# Patient Record
Sex: Male | Born: 1945
Health system: Southern US, Community
[De-identification: ages and names within clinical notes are randomized; demographics above are authoritative.]

## PROBLEM LIST (undated history)

## (undated) DIAGNOSIS — N200 Calculus of kidney: Secondary | ICD-10-CM

## (undated) DIAGNOSIS — R519 Headache, unspecified: Secondary | ICD-10-CM

## (undated) DIAGNOSIS — D649 Anemia, unspecified: Secondary | ICD-10-CM

## (undated) DIAGNOSIS — I639 Cerebral infarction, unspecified: Secondary | ICD-10-CM

## (undated) DIAGNOSIS — C181 Malignant neoplasm of appendix: Secondary | ICD-10-CM

## (undated) DIAGNOSIS — Z87442 Personal history of urinary calculi: Secondary | ICD-10-CM

## (undated) DIAGNOSIS — I10 Essential (primary) hypertension: Secondary | ICD-10-CM

## (undated) DIAGNOSIS — J189 Pneumonia, unspecified organism: Secondary | ICD-10-CM

## (undated) DIAGNOSIS — M1990 Unspecified osteoarthritis, unspecified site: Secondary | ICD-10-CM

## (undated) DIAGNOSIS — H409 Unspecified glaucoma: Secondary | ICD-10-CM

## (undated) DIAGNOSIS — K219 Gastro-esophageal reflux disease without esophagitis: Secondary | ICD-10-CM

## (undated) DIAGNOSIS — F32A Depression, unspecified: Secondary | ICD-10-CM

## (undated) DIAGNOSIS — G4733 Obstructive sleep apnea (adult) (pediatric): Secondary | ICD-10-CM

## (undated) DIAGNOSIS — F411 Generalized anxiety disorder: Secondary | ICD-10-CM

## (undated) DIAGNOSIS — R51 Headache: Secondary | ICD-10-CM

## (undated) DIAGNOSIS — Z77098 Contact with and (suspected) exposure to other hazardous, chiefly nonmedicinal, chemicals: Secondary | ICD-10-CM

## (undated) DIAGNOSIS — Z973 Presence of spectacles and contact lenses: Secondary | ICD-10-CM

## (undated) DIAGNOSIS — R399 Unspecified symptoms and signs involving the genitourinary system: Secondary | ICD-10-CM

## (undated) DIAGNOSIS — Z8619 Personal history of other infectious and parasitic diseases: Secondary | ICD-10-CM

## (undated) DIAGNOSIS — F40240 Claustrophobia: Secondary | ICD-10-CM

## (undated) DIAGNOSIS — F4310 Post-traumatic stress disorder, unspecified: Secondary | ICD-10-CM

## (undated) DIAGNOSIS — F419 Anxiety disorder, unspecified: Secondary | ICD-10-CM

## (undated) HISTORY — PX: NO PAST SURGERIES: SHX2092

## (undated) HISTORY — PX: COLONOSCOPY: SHX174

## (undated) HISTORY — PX: WISDOM TOOTH EXTRACTION: SHX21

---

## 2000-06-28 DIAGNOSIS — Z8673 Personal history of transient ischemic attack (TIA), and cerebral infarction without residual deficits: Secondary | ICD-10-CM

## 2000-06-28 HISTORY — DX: Personal history of transient ischemic attack (TIA), and cerebral infarction without residual deficits: Z86.73

## 2006-03-13 ENCOUNTER — Emergency Department (HOSPITAL_COMMUNITY): Admission: EM | Admit: 2006-03-13 | Discharge: 2006-03-13 | Payer: Self-pay | Admitting: Emergency Medicine

## 2008-06-25 ENCOUNTER — Ambulatory Visit (HOSPITAL_COMMUNITY): Admission: RE | Admit: 2008-06-25 | Discharge: 2008-06-25 | Payer: Self-pay | Admitting: Pulmonary Disease

## 2008-08-06 ENCOUNTER — Ambulatory Visit (HOSPITAL_COMMUNITY): Admission: RE | Admit: 2008-08-06 | Discharge: 2008-08-06 | Payer: Self-pay | Admitting: Pulmonary Disease

## 2008-08-09 ENCOUNTER — Ambulatory Visit (HOSPITAL_COMMUNITY): Admission: RE | Admit: 2008-08-09 | Discharge: 2008-08-09 | Payer: Self-pay | Admitting: Pulmonary Disease

## 2008-10-09 ENCOUNTER — Ambulatory Visit (HOSPITAL_COMMUNITY): Admission: RE | Admit: 2008-10-09 | Discharge: 2008-10-09 | Payer: Self-pay | Admitting: Pulmonary Disease

## 2009-02-07 ENCOUNTER — Ambulatory Visit (HOSPITAL_COMMUNITY): Admission: RE | Admit: 2009-02-07 | Discharge: 2009-02-07 | Payer: Self-pay | Admitting: Pulmonary Disease

## 2010-10-13 LAB — CREATININE, SERUM
Creatinine, Ser: 1.31 mg/dL (ref 0.4–1.5)
GFR calc non Af Amer: 55 mL/min — ABNORMAL LOW (ref >60)

## 2012-05-30 ENCOUNTER — Telehealth: Payer: Self-pay | Admitting: *Deleted

## 2012-05-30 NOTE — Telephone Encounter (Signed)
Dr Patsey Berthold has referred Mr Hirschman for a screening colonoscopy. His wife called for him to be triaged for a colonoscopy in 2014.Please call her back. Thanks.

## 2012-05-31 NOTE — Telephone Encounter (Signed)
Pt said he is not having any problems. He prefers to wait and schedule sometime toward the end of Jan or first of Feb. He is on my recall for then.

## 2012-07-04 ENCOUNTER — Telehealth: Payer: Self-pay

## 2012-07-04 NOTE — Telephone Encounter (Signed)
Pt had said he would like to schedule the end of Jan 2014 for his colonoscopy. I called and he still wants to check out his insurance first and said he will call me back.

## 2012-07-17 NOTE — Telephone Encounter (Signed)
LMOM to call.

## 2012-08-07 NOTE — Telephone Encounter (Signed)
Called.Phone number has been disconnected. Mailing a letter to call.

## 2013-11-07 ENCOUNTER — Other Ambulatory Visit (HOSPITAL_COMMUNITY): Payer: Self-pay | Admitting: Ophthalmology

## 2013-11-07 DIAGNOSIS — H34829 Venous engorgement, unspecified eye: Secondary | ICD-10-CM

## 2013-11-08 ENCOUNTER — Ambulatory Visit (HOSPITAL_COMMUNITY): Payer: Medicare Other

## 2013-11-08 ENCOUNTER — Ambulatory Visit (HOSPITAL_COMMUNITY)
Admission: RE | Admit: 2013-11-08 | Discharge: 2013-11-08 | Disposition: A | Discharge status: Home / Self Care | Payer: Medicare Other | Source: Ambulatory Visit | Attending: Ophthalmology | Admitting: Ophthalmology

## 2013-11-08 DIAGNOSIS — H34829 Venous engorgement, unspecified eye: Secondary | ICD-10-CM

## 2013-12-11 ENCOUNTER — Other Ambulatory Visit (HOSPITAL_COMMUNITY): Payer: Self-pay

## 2013-12-11 DIAGNOSIS — G4730 Sleep apnea, unspecified: Secondary | ICD-10-CM

## 2013-12-21 ENCOUNTER — Ambulatory Visit: Payer: Medicare Other | Attending: Ophthalmology | Admitting: Sleep Medicine

## 2013-12-21 DIAGNOSIS — G4733 Obstructive sleep apnea (adult) (pediatric): Secondary | ICD-10-CM | POA: Insufficient documentation

## 2013-12-21 DIAGNOSIS — G4730 Sleep apnea, unspecified: Secondary | ICD-10-CM

## 2013-12-26 NOTE — Progress Notes (Signed)
  [place] A. Merlene Laughter, MD     [email or web address]        NOCTURNAL POLYSOMNOGRAM    LOCATION: SLEEP LAB FACILITY: [place]   PHYSICIAN: Arshiya Jakes A. Merlene Laughter, M.D.   DATE OF STUDY: 12/21/2013.   REFERRING PHYSICIAN: Vernie Shanks.  INDICATIONS: This is a 68 year old who presents with fatigue, difficulty sleeping and loud snoring.  MEDICATIONS:  Prior to Admission medications   Not on File      EPWORTH SLEEPINESS SCALE: 4.   BMI: 33.   ARCHITECTURAL SUMMARY: Total recording time was 411 minutes. Sleep efficiency 77 %. Sleep latency 4 minutes. REM latency 77 minutes. Stage NI 7 %, N2 64 % and N3 11 % and REM sleep 18 %.    RESPIRATORY DATA:  Baseline oxygen saturation is 94 %. The lowest saturation is 83 %. The diagnostic AHI is 30. The RDI is 41. The REM AHI is 68. The patient did not meet criteria for a split-night recording as most of the obstructive events occurred during the latter one third of the night.  LIMB MOVEMENT SUMMARY: PLM index 0.   ELECTROCARDIOGRAM SUMMARY: Average heart rate is 61 with no significant dysrhythmias observed.   IMPRESSION:  1. Moderate to severe obstructive sleep apnea syndrome. Formal CPAP titration recording is suggested.  Thanks for this referral.  Saman Giddens A. Merlene Laughter, M.D. Diplomat, Tax adviser of Sleep Medicine.

## 2014-06-28 HISTORY — PX: COLONOSCOPY: SHX174

## 2014-08-07 DIAGNOSIS — H4011X2 Primary open-angle glaucoma, moderate stage: Secondary | ICD-10-CM | POA: Diagnosis not present

## 2014-08-07 DIAGNOSIS — H4011X3 Primary open-angle glaucoma, severe stage: Secondary | ICD-10-CM | POA: Diagnosis not present

## 2014-10-15 DIAGNOSIS — M545 Low back pain: Secondary | ICD-10-CM | POA: Diagnosis not present

## 2014-10-21 DIAGNOSIS — I10 Essential (primary) hypertension: Secondary | ICD-10-CM | POA: Diagnosis not present

## 2014-10-21 DIAGNOSIS — E785 Hyperlipidemia, unspecified: Secondary | ICD-10-CM | POA: Diagnosis not present

## 2014-10-21 DIAGNOSIS — R972 Elevated prostate specific antigen [PSA]: Secondary | ICD-10-CM | POA: Diagnosis not present

## 2014-10-21 DIAGNOSIS — E669 Obesity, unspecified: Secondary | ICD-10-CM | POA: Diagnosis not present

## 2014-10-21 DIAGNOSIS — N200 Calculus of kidney: Secondary | ICD-10-CM | POA: Diagnosis not present

## 2015-11-27 DIAGNOSIS — N200 Calculus of kidney: Secondary | ICD-10-CM

## 2015-11-27 HISTORY — DX: Calculus of kidney: N20.0

## 2015-12-01 ENCOUNTER — Emergency Department (HOSPITAL_COMMUNITY): Payer: Medicare Other

## 2015-12-01 ENCOUNTER — Emergency Department (HOSPITAL_COMMUNITY)
Admission: EM | Admit: 2015-12-01 | Discharge: 2015-12-01 | Disposition: A | Discharge status: Home / Self Care | Payer: Medicare Other | Attending: Emergency Medicine | Admitting: Emergency Medicine

## 2015-12-01 ENCOUNTER — Encounter (HOSPITAL_COMMUNITY): Payer: Self-pay | Admitting: Emergency Medicine

## 2015-12-01 DIAGNOSIS — Z79899 Other long term (current) drug therapy: Secondary | ICD-10-CM | POA: Diagnosis not present

## 2015-12-01 DIAGNOSIS — I10 Essential (primary) hypertension: Secondary | ICD-10-CM | POA: Diagnosis not present

## 2015-12-01 DIAGNOSIS — N201 Calculus of ureter: Secondary | ICD-10-CM | POA: Diagnosis not present

## 2015-12-01 DIAGNOSIS — R1030 Lower abdominal pain, unspecified: Secondary | ICD-10-CM | POA: Diagnosis present

## 2015-12-01 DIAGNOSIS — H409 Unspecified glaucoma: Secondary | ICD-10-CM | POA: Insufficient documentation

## 2015-12-01 HISTORY — DX: Unspecified glaucoma: H40.9

## 2015-12-01 HISTORY — DX: Calculus of kidney: N20.0

## 2015-12-01 HISTORY — DX: Essential (primary) hypertension: I10

## 2015-12-01 LAB — LIPASE, BLOOD: Lipase: 28 U/L (ref 11–51)

## 2015-12-01 LAB — COMPREHENSIVE METABOLIC PANEL
ALBUMIN: 3.6 g/dL (ref 3.5–5.0)
ALT: 17 U/L (ref 17–63)
ANION GAP: 7 (ref 5–15)
AST: 23 U/L (ref 15–41)
Alkaline Phosphatase: 70 U/L (ref 38–126)
BILIRUBIN TOTAL: 1.2 mg/dL (ref 0.3–1.2)
BUN: 11 mg/dL (ref 6–20)
CO2: 27 mmol/L (ref 22–32)
Calcium: 9.4 mg/dL (ref 8.9–10.3)
Chloride: 107 mmol/L (ref 101–111)
Creatinine, Ser: 1.65 mg/dL — ABNORMAL HIGH (ref 0.61–1.24)
GFR calc Af Amer: 47 mL/min — ABNORMAL LOW (ref >60)
GFR calc non Af Amer: 40 mL/min — ABNORMAL LOW (ref >60)
GLUCOSE: 120 mg/dL — AB (ref 65–99)
POTASSIUM: 3.8 mmol/L (ref 3.5–5.1)
SODIUM: 141 mmol/L (ref 135–145)
TOTAL PROTEIN: 7.2 g/dL (ref 6.5–8.1)

## 2015-12-01 LAB — URINALYSIS, ROUTINE W REFLEX MICROSCOPIC
BILIRUBIN URINE: NEGATIVE
GLUCOSE, UA: NEGATIVE mg/dL
HGB URINE DIPSTICK: NEGATIVE
KETONES UR: NEGATIVE mg/dL
Leukocytes, UA: NEGATIVE
Nitrite: NEGATIVE
PH: 8 (ref 5.0–8.0)
Protein, ur: NEGATIVE mg/dL
SPECIFIC GRAVITY, URINE: 1.022 (ref 1.005–1.030)

## 2015-12-01 LAB — CBC
HEMATOCRIT: 49.6 % (ref 39.0–52.0)
HEMOGLOBIN: 16.3 g/dL (ref 13.0–17.0)
MCH: 28 pg (ref 26.0–34.0)
MCHC: 32.9 g/dL (ref 30.0–36.0)
MCV: 85.2 fL (ref 78.0–100.0)
Platelets: 174 10*3/uL (ref 150–400)
RBC: 5.82 MIL/uL — ABNORMAL HIGH (ref 4.22–5.81)
RDW: 14.2 % (ref 11.5–15.5)
WBC: 11.6 10*3/uL — ABNORMAL HIGH (ref 4.0–10.5)

## 2015-12-01 MED ORDER — FENTANYL CITRATE (PF) 100 MCG/2ML IJ SOLN
50.0000 ug | Freq: Once | INTRAMUSCULAR | Status: AC
  Administered 2015-12-01: 50 ug via INTRAVENOUS
  Filled 2015-12-01: qty 2

## 2015-12-01 MED ORDER — TAMSULOSIN HCL 0.4 MG PO CAPS: 0.4000 mg | ORAL_CAPSULE | Freq: Two times a day (BID) | ORAL | Status: DC

## 2015-12-01 MED ORDER — IOPAMIDOL (ISOVUE-370) INJECTION 76%
80.0000 mL | Freq: Once | INTRAVENOUS | Status: AC | PRN
  Administered 2015-12-01: 80 mL via INTRAVENOUS

## 2015-12-01 MED ORDER — OXYCODONE-ACETAMINOPHEN 5-325 MG PO TABS
1.0000 | ORAL_TABLET | ORAL | Status: DC | PRN
Start: 2015-12-01 — End: 2015-12-05

## 2015-12-01 MED ORDER — IOPAMIDOL (ISOVUE-370) INJECTION 76%
INTRAVENOUS | Status: AC
  Filled 2015-12-01: qty 100

## 2015-12-01 MED ORDER — ONDANSETRON HCL 4 MG PO TABS: 4.0000 mg | ORAL_TABLET | Freq: Four times a day (QID) | ORAL | Status: DC

## 2015-12-01 MED ORDER — MORPHINE SULFATE (PF) 4 MG/ML IV SOLN
4.0000 mg | Freq: Once | INTRAVENOUS | Status: AC
  Administered 2015-12-01: 4 mg via INTRAVENOUS
  Filled 2015-12-01: qty 1

## 2015-12-01 NOTE — ED Notes (Signed)
Back pain and lower abd pain x 3 days  Had some nausea but no vomiting last bm this am good but did not help pain

## 2015-12-01 NOTE — Discharge Instructions (Signed)
Your symptoms are likely due to kidney stones. Please take your medicine as prescribed and as we discussed. Follow up with Dr. Pilar Jarvis with urology. Call his office tomorrow to set up an appointment for reevaluation. Return to ED for any new or worsening symptoms as we discussed.  Kidney Stones Kidney stones (urolithiasis) are deposits that form inside your kidneys. The intense pain is caused by the stone moving through the urinary tract. When the stone moves, the ureter goes into spasm around the stone. The stone is usually passed in the urine.  CAUSES   A disorder that makes certain neck glands produce too much parathyroid hormone (primary hyperparathyroidism).  A buildup of uric acid crystals, similar to gout in your joints.  Narrowing (stricture) of the ureter.  A kidney obstruction present at birth (congenital obstruction).  Previous surgery on the kidney or ureters.  Numerous kidney infections. SYMPTOMS   Feeling sick to your stomach (nauseous).  Throwing up (vomiting).  Blood in the urine (hematuria).  Pain that usually spreads (radiates) to the groin.  Frequency or urgency of urination. DIAGNOSIS   Taking a history and physical exam.  Blood or urine tests.  CT scan.  Occasionally, an examination of the inside of the urinary bladder (cystoscopy) is performed. TREATMENT   Observation.  Increasing your fluid intake.  Extracorporeal shock wave lithotripsy--This is a noninvasive procedure that uses shock waves to break up kidney stones.  Surgery may be needed if you have severe pain or persistent obstruction. There are various surgical procedures. Most of the procedures are performed with the use of small instruments. Only small incisions are needed to accommodate these instruments, so recovery time is minimized. The size, location, and chemical composition are all important variables that will determine the proper choice of action for you. Talk to your health care  provider to better understand your situation so that you will minimize the risk of injury to yourself and your kidney.  HOME CARE INSTRUCTIONS   Drink enough water and fluids to keep your urine clear or pale yellow. This will help you to pass the stone or stone fragments.  Strain all urine through the provided strainer. Keep all particulate matter and stones for your health care provider to see. The stone causing the pain may be as small as a grain of salt. It is very important to use the strainer each and every time you pass your urine. The collection of your stone will allow your health care provider to analyze it and verify that a stone has actually passed. The stone analysis will often identify what you can do to reduce the incidence of recurrences.  Only take over-the-counter or prescription medicines for pain, discomfort, or fever as directed by your health care provider.  Keep all follow-up visits as told by your health care provider. This is important.  Get follow-up X-rays if required. The absence of pain does not always mean that the stone has passed. It may have only stopped moving. If the urine remains completely obstructed, it can cause loss of kidney function or even complete destruction of the kidney. It is your responsibility to make sure X-rays and follow-ups are completed. Ultrasounds of the kidney can show blockages and the status of the kidney. Ultrasounds are not associated with any radiation and can be performed easily in a matter of minutes.  Make changes to your daily diet as told by your health care provider. You may be told to:  Limit the amount of salt that  you eat.  Eat 5 or more servings of fruits and vegetables each day.  Limit the amount of meat, poultry, fish, and eggs that you eat.  Collect a 24-hour urine sample as told by your health care provider.You may need to collect another urine sample every 6-12 months. SEEK MEDICAL CARE IF:  You experience pain that  is progressive and unresponsive to any pain medicine you have been prescribed. SEEK IMMEDIATE MEDICAL CARE IF:   Pain cannot be controlled with the prescribed medicine.  You have a fever or shaking chills.  The severity or intensity of pain increases over 18 hours and is not relieved by pain medicine.  You develop a new onset of abdominal pain.  You feel faint or pass out.  You are unable to urinate.   This information is not intended to replace advice given to you by your health care provider. Make sure you discuss any questions you have with your health care provider.   Document Released: 06/14/2005 Document Revised: 03/05/2015 Document Reviewed: 11/15/2012 Elsevier Interactive Patient Education [employer].

## 2015-12-01 NOTE — ED Notes (Signed)
Pt verbalized understanding of d/c instructions, prescriptions, and follow-up care. No further questions/concerns, VSS, ambulatory w/ steady gait (refused wheelchair) 

## 2015-12-01 NOTE — ED Notes (Signed)
Pt returned from CT, unable to have scan b/c IV infiltrated. This RN attempted IV access again w/o success.

## 2015-12-01 NOTE — ED Provider Notes (Signed)
CSN: [id number]     Arrival date & time 12/01/15  [id number] History   First MD Initiated Contact with Patient 12/01/15 1014     Chief Complaint  Patient presents with  . Abdominal Pain  . Back Pain     (Consider location/radiation/quality/duration/timing/severity/associated sxs/prior Treatment) HPI James Mckinney is a 70 y.o. male with history of hypertension, glaucoma here for evaluation of lower abdominal and back pain. Patient reports he has had gradually worsening low back pain that has become worse and migrated to his lower abdomen. He cannot characterize his sensation other than painful, but reports that he feels like it is moving. He reports associated nausea, but no emesis, chest pain or shortness of breath, numbness or weakness, vision changes, changes in urinary or bowel habits. He tried ibuprofen this morning without relief of his symptoms. Walking and moving seems to exacerbate discomfort.  Past Medical History  Diagnosis Date  . Hypertension   . Kidney stone   . Glaucoma    History reviewed. No pertinent past surgical history. No family history on file. Social History  Substance Use Topics  . Smoking status: Never Smoker   . Smokeless tobacco: None  . Alcohol Use: None    Review of Systems A 10 point review of systems was completed and was negative except for pertinent positives and negatives as mentioned in the history of present illness     Allergies  Review of patient's allergies indicates no known allergies.  Home Medications   Prior to Admission medications   Medication Sig Start Date End Date Taking? Authorizing Provider  dorzolamide (TRUSOPT) 2 % ophthalmic solution Place 1 drop into both eyes 2 (two) times daily.  10/15/15  Yes Historical Provider, MD  latanoprost (XALATAN) 0.005 % ophthalmic solution Place 1 drop into both eyes at bedtime.  10/15/15  Yes Historical Provider, MD  lisinopril-hydrochlorothiazide (PRINZIDE,ZESTORETIC) 20-12.5 MG tablet Take 1  tablet by mouth daily. 11/27/15  Yes Historical Provider, MD  ondansetron (ZOFRAN) 4 MG tablet Take 1 tablet (4 mg total) by mouth every 6 (six) hours. 12/01/15   Comer Locket, PA-C  oxyCODONE-acetaminophen (PERCOCET/ROXICET) 5-325 MG tablet Take 1-2 tablets by mouth every 4 (four) hours as needed for severe pain. 12/01/15   Comer Locket, PA-C  tamsulosin (FLOMAX) 0.4 MG CAPS capsule Take 1 capsule (0.4 mg total) by mouth 2 (two) times daily. 12/01/15   Comer Locket, PA-C   BP 158/89 mmHg  Pulse 71  Temp(Src) 98.7 F (37.1 C) (Oral)  Resp 16  Wt 114.306 kg  SpO2 97% Physical Exam  Constitutional: He is oriented to person, place, and time. He appears well-developed and well-nourished.  HENT:  Head: Normocephalic and atraumatic.  Mouth/Throat: Oropharynx is clear and moist.  Eyes: Conjunctivae are normal. Pupils are equal, round, and reactive to light. Right eye exhibits no discharge. Left eye exhibits no discharge. No scleral icterus.  Neck: Neck supple.  Cardiovascular: Normal rate, regular rhythm and normal heart sounds.   Pulmonary/Chest: Effort normal and breath sounds normal. No respiratory distress. He has no wheezes. He has no rales.  Abdominal: Soft.  Tenderness diffusely and mid abdomen. No rebound or guarding. No pulsatile mass. No other abnormalities.  Musculoskeletal: He exhibits no tenderness.  Neurological: He is alert and oriented to person, place, and time.  Cranial Nerves II-XII grossly intact  Skin: Skin is warm and dry. No rash noted.  Psychiatric: He has a normal mood and affect.  Nursing note and vitals reviewed.   ED  Course  Procedures (including critical care time) Labs Review Labs Reviewed  COMPREHENSIVE METABOLIC PANEL - Abnormal; Notable for the following:    Glucose, Bld 120 (*)    Creatinine, Ser 1.65 (*)    GFR calc non Af Amer 40 (*)    GFR calc Af Amer 47 (*)    All other components within normal limits  CBC - Abnormal; Notable for the  following:    WBC 11.6 (*)    RBC 5.82 (*)    All other components within normal limits  LIPASE, BLOOD  URINALYSIS, ROUTINE W REFLEX MICROSCOPIC (NOT AT [facility])    Imaging Review Ct Angio Abdomen W/cm &/or Wo Contrast  12/01/2015  CLINICAL DATA:  Acute onset of severe abdominal and flank pain. EXAM: CT ANGIOGRAPHY ABDOMEN TECHNIQUE: Multidetector CT imaging of the abdomen was performed using the standard protocol during bolus administration of intravenous contrast. Multiplanar reconstructed images including MIPs were obtained and reviewed to evaluate the vascular anatomy. CONTRAST:  80 cc Isovue 370 COMPARISON:  None. FINDINGS: Lower chest: The lung bases are clear of acute process. No pleural effusion or pulmonary lesions. The heart is normal in size. No pericardial effusion. The distal esophagus and aorta are unremarkable. Hepatobiliary: No focal hepatic lesions or intrahepatic biliary dilatation. The gallbladder is normal. No common bile duct dilatation. Pancreas: No mass, inflammation or ductal dilatation. Spleen: Normal size.  No focal lesions. Adrenals/Urinary Tract: Small left adrenal gland nodule is likely a benign adenoma and not significantly changed since a prior chest CT. The right adrenal gland is normal. There are bilateral renal calculi and mild bilateral hydronephrosis. There is a 5 mm left upper ureteral calculus. There is right-sided hydroureter and perinephric interstitial changes and fluid consistent with obstruction. However, the scan does not include the pelvis but I suspect there is a distal right ureteral calculus causing obstruction. Stomach/Bowel: The stomach, duodenum, visualized small bowel and visualized colon are unremarkable. No inflammatory changes, mass lesions or obstructive findings. Vascular/Lymphatic: The aorta is normal in caliber. Minimal scattered atherosclerotic calcifications for age. No dissection. The branch vessels are patent. New line no mesenteric or  retroperitoneal mass or lymphadenopathy. Other: No ascites or abdominal wall hernia. Musculoskeletal: Scoliosis and degenerative lumbar spondylosis but no significant or acute bony findings. Review of the MIP images confirms the above findings. IMPRESSION: 1. Right-sided hydroureteronephrosis most likely due to an obstructing distal ureteral calculus but this study did not include the pelvis and a stone is not identified. 2. 5 mm left upper ureteral calculus causing mild obstruction. 3. Bilateral renal calculi. 4. Normal aorta for age. Electronically Signed   By: Marijo Sanes M.D.   On: 12/01/2015 14:20   I have personally reviewed and evaluated these images and lab results as part of my medical decision-making.   EKG Interpretation None     Meds given in ED:  Medications  fentaNYL (SUBLIMAZE) injection 50 mcg (50 mcg Intravenous Given 12/01/15 1232)  iopamidol (ISOVUE-370) 76 % injection 80 mL (80 mLs Intravenous Contrast Given 12/01/15 1141)  fentaNYL (SUBLIMAZE) injection 50 mcg (50 mcg Intravenous Given 12/01/15 1406)  morphine 4 MG/ML injection 4 mg (4 mg Intravenous Given 12/01/15 1440)    Discharge Medication List as of 12/01/2015  2:40 PM    START taking these medications   Details  ondansetron (ZOFRAN) 4 MG tablet Take 1 tablet (4 mg total) by mouth every 6 (six) hours., Starting 12/01/2015, Until Discontinued, Print    oxyCODONE-acetaminophen (PERCOCET/ROXICET) 5-325 MG tablet Take 1-2  tablets by mouth every 4 (four) hours as needed for severe pain., Starting 12/01/2015, Until Discontinued, Print    tamsulosin (FLOMAX) 0.4 MG CAPS capsule Take 1 capsule (0.4 mg total) by mouth 2 (two) times daily., Starting 12/01/2015, Until Discontinued, Print       Filed Vitals:   12/01/15 1230 12/01/15 1300 12/01/15 1330 12/01/15 1430  BP: 127/96 131/77 154/83 158/89  Pulse: 72 72 76 71  Temp:      TempSrc:      Resp: 16 16 16 16   Weight:      SpO2: 96% 94% 97% 97%    MDM  Patient presents with  low back pain and abdominal pain with nausea. Vital signs are stable and he is afebrile. Pulses are intact and equal bilaterally. However, given age and history of hypertension, some concern for AAA, we will obtain CT abdomen angiogram. CT is negative for any aneurysm, however there is evidence of left-sided ureteral calculus, 5 mm. There is also a right-sided hydroureteronephrosis was likely due to obstructing distal stone, but study did not include pelvis. Discussed with patient likelihood of stone being source of discomfort and he decided to forego further radiation imaging. Patient also admits that this pain feels very similar to his previous kidney stone pain. Screening labs are reassuring, very mild leukocytosis at 11.6, creatinine appears to be patient's baseline at 1.6. Pain treated in emergency department, given referral to urologist for reevaluation in the next 2 days. Patient and wife at bedside verbalized understanding and agreement with this plan as well as subsequent discharge. I know to return for any new or worsening symptoms. Final diagnoses:  Ureterolithiasis        Comer Locket, PA-C 12/01/15 1606  Leo Grosser, MD 12/01/15 [phone]

## 2015-12-01 NOTE — ED Provider Notes (Signed)
Medical screening examination/treatment/procedure(s) were conducted as a shared visit with non-physician practitioner(s) and myself.  I personally evaluated the patient during the encounter.   EKG Interpretation None     70 y.o. male presents with abdominal pain, No acute distress on exam. Plan for CT and disposition per results.   See related encounter note  Procedure note: Ultrasound Guided Peripheral IV Ultrasound guided peripheral 1.88 inch angiocath IV placement performed by me. Indications: Nursing unable to place IV. Details: The antecubital fossa and upper arm were evaluated with a multifrequency linear probe. Patent brachial veins were noted. 1 attempt was made to cannulate a vein under realtime US guidance with successful cannulation of the vein and catheter placement. There is return of non-pulsatile dark red blood. The patient tolerated the procedure well without complications. Images archived electronically.  CPT codes: [phone] and [id number]   Leo Grosser, MD 12/01/15 [phone]

## 2015-12-03 ENCOUNTER — Observation Stay (HOSPITAL_COMMUNITY)
Admission: EM | Admit: 2015-12-03 | Discharge: 2015-12-05 | Disposition: A | Discharge status: Home / Self Care | Payer: Medicare Other | Attending: Internal Medicine | Admitting: Internal Medicine

## 2015-12-03 ENCOUNTER — Encounter (HOSPITAL_COMMUNITY): Payer: Self-pay | Admitting: *Deleted

## 2015-12-03 ENCOUNTER — Emergency Department (HOSPITAL_COMMUNITY): Payer: Medicare Other

## 2015-12-03 DIAGNOSIS — I10 Essential (primary) hypertension: Secondary | ICD-10-CM

## 2015-12-03 DIAGNOSIS — N202 Calculus of kidney with calculus of ureter: Secondary | ICD-10-CM | POA: Diagnosis not present

## 2015-12-03 DIAGNOSIS — N132 Hydronephrosis with renal and ureteral calculous obstruction: Secondary | ICD-10-CM

## 2015-12-03 DIAGNOSIS — M1990 Unspecified osteoarthritis, unspecified site: Secondary | ICD-10-CM | POA: Diagnosis not present

## 2015-12-03 DIAGNOSIS — N179 Acute kidney failure, unspecified: Secondary | ICD-10-CM

## 2015-12-03 DIAGNOSIS — N201 Calculus of ureter: Secondary | ICD-10-CM

## 2015-12-03 DIAGNOSIS — D72829 Elevated white blood cell count, unspecified: Secondary | ICD-10-CM

## 2015-12-03 DIAGNOSIS — K5900 Constipation, unspecified: Secondary | ICD-10-CM

## 2015-12-03 DIAGNOSIS — N200 Calculus of kidney: Secondary | ICD-10-CM

## 2015-12-03 DIAGNOSIS — H409 Unspecified glaucoma: Secondary | ICD-10-CM | POA: Diagnosis not present

## 2015-12-03 DIAGNOSIS — N1330 Unspecified hydronephrosis: Secondary | ICD-10-CM | POA: Insufficient documentation

## 2015-12-03 HISTORY — DX: Headache: R51

## 2015-12-03 HISTORY — DX: Calculus of kidney: N20.0

## 2015-12-03 HISTORY — DX: Unspecified osteoarthritis, unspecified site: M19.90

## 2015-12-03 HISTORY — DX: Headache, unspecified: R51.9

## 2015-12-03 LAB — BASIC METABOLIC PANEL
ANION GAP: 5 (ref 5–15)
Anion gap: 11 (ref 5–15)
BUN: 19 mg/dL (ref 6–20)
BUN: 18 mg/dL (ref 6–20)
CALCIUM: 9.5 mg/dL (ref 8.9–10.3)
CHLORIDE: 105 mmol/L (ref 101–111)
CO2: 23 mmol/L (ref 22–32)
CO2: 28 mmol/L (ref 22–32)
CREATININE: 2.17 mg/dL — AB (ref 0.61–1.24)
Calcium: 8.6 mg/dL — ABNORMAL LOW (ref 8.9–10.3)
Chloride: 102 mmol/L (ref 101–111)
Creatinine, Ser: 2.26 mg/dL — ABNORMAL HIGH (ref 0.61–1.24)
GFR calc Af Amer: 32 mL/min — ABNORMAL LOW (ref >60)
GFR calc non Af Amer: 29 mL/min — ABNORMAL LOW (ref >60)
GFR, EST AFRICAN AMERICAN: 34 mL/min — AB (ref >60)
GFR, EST NON AFRICAN AMERICAN: 28 mL/min — AB (ref >60)
GLUCOSE: 101 mg/dL — AB (ref 65–99)
Glucose, Bld: 144 mg/dL — ABNORMAL HIGH (ref 65–99)
POTASSIUM: 3.7 mmol/L (ref 3.5–5.1)
Potassium: 3.6 mmol/L (ref 3.5–5.1)
SODIUM: 136 mmol/L (ref 135–145)
Sodium: 138 mmol/L (ref 135–145)

## 2015-12-03 LAB — CBC
HCT: 47.4 % (ref 39.0–52.0)
Hemoglobin: 15.5 g/dL (ref 13.0–17.0)
MCH: 27.7 pg (ref 26.0–34.0)
MCHC: 32.7 g/dL (ref 30.0–36.0)
MCV: 84.8 fL (ref 78.0–100.0)
PLATELETS: 157 10*3/uL (ref 150–400)
RBC: 5.59 MIL/uL (ref 4.22–5.81)
RDW: 14.3 % (ref 11.5–15.5)
WBC: 13.8 10*3/uL — AB (ref 4.0–10.5)

## 2015-12-03 LAB — URINALYSIS, ROUTINE W REFLEX MICROSCOPIC
BILIRUBIN URINE: NEGATIVE
GLUCOSE, UA: NEGATIVE mg/dL
KETONES UR: 15 mg/dL — AB
Leukocytes, UA: NEGATIVE
Nitrite: NEGATIVE
PROTEIN: NEGATIVE mg/dL
Specific Gravity, Urine: 1.026 (ref 1.005–1.030)
pH: 5.5 (ref 5.0–8.0)

## 2015-12-03 LAB — URINE MICROSCOPIC-ADD ON

## 2015-12-03 MED ORDER — HYDROMORPHONE HCL 1 MG/ML IJ SOLN
1.0000 mg | INTRAMUSCULAR | Status: DC | PRN
Start: 2015-12-03 — End: 2015-12-05
  Filled 2015-12-03: qty 1

## 2015-12-03 MED ORDER — KETOROLAC TROMETHAMINE 30 MG/ML IJ SOLN
15.0000 mg | Freq: Three times a day (TID) | INTRAMUSCULAR | Status: DC
Start: 2015-12-03 — End: 2015-12-04
  Administered 2015-12-03: 15 mg via INTRAVENOUS
  Filled 2015-12-03 (×2): qty 1

## 2015-12-03 MED ORDER — HYDROMORPHONE HCL 1 MG/ML IJ SOLN
1.0000 mg | Freq: Once | INTRAMUSCULAR | Status: AC
  Administered 2015-12-03: 1 mg via INTRAVENOUS
  Filled 2015-12-03: qty 1

## 2015-12-03 MED ORDER — FENTANYL CITRATE (PF) 100 MCG/2ML IJ SOLN
INTRAMUSCULAR | Status: AC
  Filled 2015-12-03: qty 2

## 2015-12-03 MED ORDER — FENTANYL CITRATE (PF) 100 MCG/2ML IJ SOLN
50.0000 ug | INTRAMUSCULAR | Status: DC | PRN
  Administered 2015-12-03: 50 ug via NASAL

## 2015-12-03 MED ORDER — SODIUM CHLORIDE 0.9 % IV BOLUS (SEPSIS)
1000.0000 mL | Freq: Once | INTRAVENOUS | Status: AC
  Administered 2015-12-03: 1000 mL via INTRAVENOUS

## 2015-12-03 MED ORDER — SENNA 8.6 MG PO TABS
1.0000 | ORAL_TABLET | Freq: Two times a day (BID) | ORAL | Status: DC
  Administered 2015-12-03 – 2015-12-05 (×5): 8.6 mg via ORAL
  Filled 2015-12-03 (×5): qty 1

## 2015-12-03 MED ORDER — IOPAMIDOL (ISOVUE-370) INJECTION 76%
100.0000 mL | Freq: Once | INTRAVENOUS | Status: AC | PRN
  Administered 2015-12-01: 100 mL via INTRAVENOUS

## 2015-12-03 MED ORDER — SODIUM CHLORIDE 0.45 % IV SOLN
INTRAVENOUS | Status: DC
  Administered 2015-12-03 – 2015-12-04 (×4): via INTRAVENOUS

## 2015-12-03 MED ORDER — MORPHINE SULFATE (PF) 4 MG/ML IV SOLN
4.0000 mg | INTRAVENOUS | Status: DC | PRN
  Administered 2015-12-03: 4 mg via INTRAVENOUS
  Filled 2015-12-03: qty 1

## 2015-12-03 MED ORDER — LATANOPROST 0.005 % OP SOLN
1.0000 [drp] | Freq: Every day | OPHTHALMIC | Status: DC
  Administered 2015-12-03 – 2015-12-04 (×2): 1 [drp] via OPHTHALMIC
  Filled 2015-12-03: qty 2.5

## 2015-12-03 MED ORDER — ONDANSETRON HCL 4 MG PO TABS: 4.0000 mg | ORAL_TABLET | Freq: Four times a day (QID) | ORAL | Status: DC | PRN

## 2015-12-03 MED ORDER — POLYETHYLENE GLYCOL 3350 17 G PO PACK
17.0000 g | PACK | Freq: Every day | ORAL | Status: DC | PRN
  Administered 2015-12-04: 17 g via ORAL
  Filled 2015-12-03: qty 1

## 2015-12-03 MED ORDER — ENOXAPARIN SODIUM 40 MG/0.4ML ~~LOC~~ SOLN
40.0000 mg | SUBCUTANEOUS | Status: DC
Start: 2015-12-03 — End: 2015-12-05
  Administered 2015-12-03 – 2015-12-05 (×3): 40 mg via SUBCUTANEOUS
  Filled 2015-12-03 (×2): qty 0.4

## 2015-12-03 MED ORDER — DORZOLAMIDE HCL 2 % OP SOLN
1.0000 [drp] | Freq: Two times a day (BID) | OPHTHALMIC | Status: DC
  Administered 2015-12-03 – 2015-12-05 (×5): 1 [drp] via OPHTHALMIC
  Filled 2015-12-03: qty 10

## 2015-12-03 MED ORDER — HYDRALAZINE HCL 20 MG/ML IJ SOLN: 5.0000 mg | INTRAMUSCULAR | Status: DC | PRN

## 2015-12-03 MED ORDER — KETOROLAC TROMETHAMINE 30 MG/ML IJ SOLN
30.0000 mg | Freq: Three times a day (TID) | INTRAMUSCULAR | Status: DC
  Administered 2015-12-03 (×2): 30 mg via INTRAVENOUS
  Filled 2015-12-03 (×2): qty 1

## 2015-12-03 MED ORDER — TAMSULOSIN HCL 0.4 MG PO CAPS
0.4000 mg | ORAL_CAPSULE | Freq: Two times a day (BID) | ORAL | Status: DC
  Administered 2015-12-03 – 2015-12-05 (×5): 0.4 mg via ORAL
  Filled 2015-12-03 (×5): qty 1

## 2015-12-03 MED ORDER — HYDROCHLOROTHIAZIDE 12.5 MG PO CAPS
12.5000 mg | ORAL_CAPSULE | Freq: Every day | ORAL | Status: DC
  Administered 2015-12-03 – 2015-12-04 (×2): 12.5 mg via ORAL
  Filled 2015-12-03 (×2): qty 1

## 2015-12-03 MED ORDER — ONDANSETRON HCL 4 MG/2ML IJ SOLN
4.0000 mg | Freq: Once | INTRAMUSCULAR | Status: AC
  Administered 2015-12-03: 4 mg via INTRAVENOUS
  Filled 2015-12-03: qty 2

## 2015-12-03 MED ORDER — ONDANSETRON HCL 4 MG/2ML IJ SOLN: 4.0000 mg | Freq: Four times a day (QID) | INTRAMUSCULAR | Status: DC | PRN

## 2015-12-03 NOTE — ED Provider Notes (Signed)
CSN: [id number]     Arrival date & time 12/03/15  0122 History  By signing my name below, I, Altamease Oiler, attest that this documentation has been prepared under the direction and in the presence of Merryl Hacker, MD. Electronically Signed: Altamease Oiler, ED Scribe. 12/03/2015. 3:09 AM   Chief Complaint  Patient presents with  . Nephrolithiasis   The history is provided by the patient. No language interpreter was used.   PAETON MARSDEN is a 70 y.o. male with history of kidney stones who presents to the Emergency Department complaining of ongoing, waxing and waning, 8/10 in severity, bilateral flank pain with onset at least 2 days ago. This pain is similar to pain that he has had in the past with kidney stones and pt was seen in the ED 2 days ago and had a scan showing a 5 mm left-sided ureteral calculus. The Percocet/Roxicet 5-325 mg that he was prescribed has provided insufficient relief at home and he only has 1 tablet left. Pt denies fever, dysuria, and hematuria.   Of note, patient had a CTA in the emergency room which incidentally showed the left renal stone. He also had hydronephrosis on the right concerning for a distal right stone that was not visualized because it did not include the pelvis.  Past Medical History  Diagnosis Date  . Hypertension   . Kidney stone   . Glaucoma    History reviewed. No pertinent past surgical history. No family history on file. Social History  Substance Use Topics  . Smoking status: Never Smoker   . Smokeless tobacco: Never Used  . Alcohol Use: No    Review of Systems  Constitutional: Negative for fever.  Respiratory: Negative for shortness of breath.   Cardiovascular: Negative for chest pain.  Gastrointestinal: Positive for abdominal pain. Negative for nausea and vomiting.  Genitourinary: Positive for flank pain. Negative for dysuria and hematuria.  All other systems reviewed and are negative.  Allergies  Review of patient's  allergies indicates no known allergies.  Home Medications   Prior to Admission medications   Medication Sig Start Date End Date Taking? Authorizing Provider  dorzolamide (TRUSOPT) 2 % ophthalmic solution Place 1 drop into both eyes 2 (two) times daily.  10/15/15  Yes Historical Provider, MD  latanoprost (XALATAN) 0.005 % ophthalmic solution Place 1 drop into both eyes at bedtime.  10/15/15  Yes Historical Provider, MD  lisinopril-hydrochlorothiazide (PRINZIDE,ZESTORETIC) 20-12.5 MG tablet Take 1 tablet by mouth daily. 11/27/15  Yes Historical Provider, MD  ondansetron (ZOFRAN) 4 MG tablet Take 1 tablet (4 mg total) by mouth every 6 (six) hours. 12/01/15  Yes Comer Locket, PA-C  oxyCODONE-acetaminophen (PERCOCET/ROXICET) 5-325 MG tablet Take 1-2 tablets by mouth every 4 (four) hours as needed for severe pain. 12/01/15  Yes Comer Locket, PA-C  tamsulosin (FLOMAX) 0.4 MG CAPS capsule Take 1 capsule (0.4 mg total) by mouth 2 (two) times daily. 12/01/15  Yes Benjamin Cartner, PA-C   BP 169/90 mmHg  Pulse 87  Temp(Src) 97.9 F (36.6 C) (Oral)  Resp 18  Ht 6\' 1"  (1.854 m)  Wt 250 lb (113.399 kg)  BMI 32.99 kg/m2  SpO2 98% Physical Exam  Constitutional: He is oriented to person, place, and time. He appears well-developed and well-nourished. No distress.  HENT:  Head: Normocephalic and atraumatic.  Cardiovascular: Normal rate, regular rhythm and normal heart sounds.   No murmur heard. Pulmonary/Chest: Effort normal and breath sounds normal. No respiratory distress. He has no wheezes.  Abdominal: Soft. Bowel sounds are normal. There is no tenderness. There is no rebound and no guarding.  Genitourinary:  No CVA tenderness  Musculoskeletal: He exhibits no edema.  Neurological: He is alert and oriented to person, place, and time.  Skin: Skin is warm and dry.  Psychiatric: He has a normal mood and affect.  Nursing note and vitals reviewed.   ED Course  Procedures (including critical care  time) DIAGNOSTIC STUDIES: Oxygen Saturation is 98% on RA,  normal by my interpretation.    COORDINATION OF CARE: 3:05 AM Discussed treatment plan which includes lab work and pain medication with pt at bedside and pt agreed to plan.  Labs Review Labs Reviewed  URINALYSIS, ROUTINE W REFLEX MICROSCOPIC (NOT AT [facility]) - Abnormal; Notable for the following:    Hgb urine dipstick LARGE (*)    Ketones, ur 15 (*)    All other components within normal limits  BASIC METABOLIC PANEL - Abnormal; Notable for the following:    Glucose, Bld 144 (*)    Creatinine, Ser 2.17 (*)    GFR calc non Af Amer 29 (*)    GFR calc Af Amer 34 (*)    All other components within normal limits  CBC - Abnormal; Notable for the following:    WBC 13.8 (*)    All other components within normal limits  URINE MICROSCOPIC-ADD ON - Abnormal; Notable for the following:    Squamous Epithelial / LPF 0-5 (*)    Bacteria, UA RARE (*)    All other components within normal limits    Imaging Review Dg Abd 1 View  12/03/2015  CLINICAL DATA:  Right flank pain for 2-3 days.  Nausea. EXAM: ABDOMEN - 1 VIEW COMPARISON:  Abdominal CT 12/01/2015.  Abdomen pelvis CT 03/16/2006 FINDINGS: A right pelvic calcification does not perfectly correlate with phlebolith 2007 CT. There is a 5 mm stone over the left flank correlating with left nephrolithiasis. Right renal calculus on comparison CT is not seen but could be obscured by extensive gas and stool over the right flank. Nonobstructive bowel gas pattern. No concerning intra-abdominal mass effect. IMPRESSION: 1. Left nephrolithiasis near the UPJ, position similar to 12/01/2015 abdominal CT. 2. 3 mm right pelvic ureteral calculus versus phlebolith. Electronically Signed   By: Monte Fantasia M.D.   On: 12/03/2015 05:09   Ct Angio Abdomen W/cm &/or Wo Contrast  12/01/2015  CLINICAL DATA:  Acute onset of severe abdominal and flank pain. EXAM: CT ANGIOGRAPHY ABDOMEN TECHNIQUE: Multidetector CT imaging  of the abdomen was performed using the standard protocol during bolus administration of intravenous contrast. Multiplanar reconstructed images including MIPs were obtained and reviewed to evaluate the vascular anatomy. CONTRAST:  80 cc Isovue 370 COMPARISON:  None. FINDINGS: Lower chest: The lung bases are clear of acute process. No pleural effusion or pulmonary lesions. The heart is normal in size. No pericardial effusion. The distal esophagus and aorta are unremarkable. Hepatobiliary: No focal hepatic lesions or intrahepatic biliary dilatation. The gallbladder is normal. No common bile duct dilatation. Pancreas: No mass, inflammation or ductal dilatation. Spleen: Normal size.  No focal lesions. Adrenals/Urinary Tract: Small left adrenal gland nodule is likely a benign adenoma and not significantly changed since a prior chest CT. The right adrenal gland is normal. There are bilateral renal calculi and mild bilateral hydronephrosis. There is a 5 mm left upper ureteral calculus. There is right-sided hydroureter and perinephric interstitial changes and fluid consistent with obstruction. However, the scan does not include the  pelvis but I suspect there is a distal right ureteral calculus causing obstruction. Stomach/Bowel: The stomach, duodenum, visualized small bowel and visualized colon are unremarkable. No inflammatory changes, mass lesions or obstructive findings. Vascular/Lymphatic: The aorta is normal in caliber. Minimal scattered atherosclerotic calcifications for age. No dissection. The branch vessels are patent. New line no mesenteric or retroperitoneal mass or lymphadenopathy. Other: No ascites or abdominal wall hernia. Musculoskeletal: Scoliosis and degenerative lumbar spondylosis but no significant or acute bony findings. Review of the MIP images confirms the above findings. IMPRESSION: 1. Right-sided hydroureteronephrosis most likely due to an obstructing distal ureteral calculus but this study did not  include the pelvis and a stone is not identified. 2. 5 mm left upper ureteral calculus causing mild obstruction. 3. Bilateral renal calculi. 4. Normal aorta for age. Electronically Signed   By: Marijo Sanes M.D.   On: 12/01/2015 14:20   US Renal  12/03/2015  CLINICAL DATA:  Kidney stones EXAM: RENAL / URINARY TRACT ULTRASOUND COMPLETE COMPARISON:  Abdominal CT 2 days ago FINDINGS: Right Kidney: Length: 12 cm. No hydronephrosis. No evidence solid mass. No perinephric collection. Lower pole calculus seen on comparison CT. Left Kidney: Length: 12.6 cm. No hydronephrosis. Renal pelvis stone seen on comparison CT is not visualized. No evidence of solid mass. Bladder: 45 mm projecting into the bladder base has central divot and is likely the enlarged prostate seen by 2007 abdominal CT. IMPRESSION: 1. Known nephrolithiasis.  No hydronephrosis. 2. Mass projecting into the lower bladder is almost certainly prostatomegaly, but is partially visualized. If no recent outside comparison imaging, CT could confirm. Electronically Signed   By: Monte Fantasia M.D.   On: 12/03/2015 04:55   I have personally reviewed and evaluated these lab results as part of my medical decision-making.   EKG Interpretation None      MDM   Final diagnoses:  Nephrolithiasis  AKI (acute kidney injury) [facility])    Patient presents with worsening pain. Bilateral. Had a CT scan on Monday that was incomplete but did show a left-sided renal stone and suspicious for right-sided stone. He also had hydronephrosis. Patient was given fluids and pain medication. Repeat lab work with leukocytosis to 13.8. Urinalysis with ketones but no leuks or signs of urinary tract infection. Creatinine bumped to 2.17.  KUB is questioning a 3 mm right ureteral calculus in addition to left nephrolithiasis. Ultrasound shows no significant hydronephrosis. I'm concerned given patient's worsening renal function and possible bilateral stones. Discussed with Dr.  Gaynelle Arabian.  Will obtain CT renal stone study to better visualize possible right sided stone. He will be evaluated by urology. Admit to the hospitalist for hydration and pain control.  I personally performed the services described in this documentation, which was scribed in my presence. The recorded information has been reviewed and is accurate.    Merryl Hacker, MD 12/03/15 [phone]

## 2015-12-03 NOTE — ED Notes (Signed)
Patient presents with c/o bilateral side pain   Dx with kidney stones on Monday and got worse yesterday

## 2015-12-03 NOTE — Consult Note (Signed)
Urology Consult  Referring physician:  Dr. Thayer Jew                                    Drf. Kennieth Rad Reason for referral:  Kidney stones  Impression/Assessment:    Pt has 74m L upper ureteral stone, with continued pain. He has Right hydro, but CT did not have CT that showed the pelvis on the R, and will have pelvic CT this A\M to R/O R lower ureteral stone.    Plan:   F/u by Dr. BElvin So  History of Present Illness: CTATE James Mckinney a 70y.o. male with history of kidney stones who presents to the Emergency Department complaining of ongoing, waxing and waning, 8/10 in severity, bilateral flank pain with onset at least 2 days ago. This pain is similar to pain that he has had in the past with kidney stones and pt was seen in the ED 2 days ago and had a scan showing a 5 mm left-sided ureteral calculus. The Percocet/Roxicet 5-325 mg that he was prescribed has provided insufficient relief at home and he only has 1 tablet left. Pt denies fever, dysuria, and hematuria.   Of note, patient had a CTA in the emergency room which incidentally showed the left renal stone. He also had hydronephrosis on the right concerning for a distal right stone that was not visualized because it did not include the pelvis.  Past Medical History  Diagnosis Date  . Hypertension   . Kidney stone   . Glaucoma    History reviewed. No pertinent past surgical history.  Medications:   Prior to Admission medications   Medication Sig Start Date End Date Taking? Authorizing Provider  dorzolamide (TRUSOPT) 2 % ophthalmic solution Place 1 drop into both eyes 2 (two) times daily.  10/15/15  Yes Historical Provider, MD  latanoprost (XALATAN) 0.005 % ophthalmic solution Place 1 drop into both eyes at bedtime.  10/15/15  Yes Historical Provider, MD  lisinopril-hydrochlorothiazide (PRINZIDE,ZESTORETIC) 20-12.5 MG tablet Take 1 tablet by mouth daily. 11/27/15  Yes Historical Provider, MD   ondansetron (ZOFRAN) 4 MG tablet Take 1 tablet (4 mg total) by mouth every 6 (six) hours. 12/01/15  Yes BComer Locket PA-C  oxyCODONE-acetaminophen (PERCOCET/ROXICET) 5-325 MG tablet Take 1-2 tablets by mouth every 4 (four) hours as needed for severe pain. 12/01/15  Yes BComer Locket PA-C  tamsulosin (FLOMAX) 0.4 MG CAPS capsule Take 1 capsule (0.4 mg total) by mouth 2 (two) times daily. 12/01/15  Yes BComer Locket PA-C         Allergies: No Known Allergies  No family history on file.  Social History:  reports that he has never smoked. He has never used smokeless tobacco. He reports that he does not drink alcohol or use illicit drugs.  ROS Constitutional: Negative for fever.  Respiratory: Negative for shortness of breath.  Cardiovascular: Negative for chest pain.  Gastrointestinal: Positive for abdominal pain. Negative for nausea and vomiting.  Genitourinary: Positive for flank pain. Negative for dysuria and hematuria.  All other systems reviewed and are negative.  Physical Exam:  Vital signs in last 24 hours: Temp:  [97.9 F (36.6 C)] 97.9 F (36.6 C) (06/07 0129) Pulse Rate:  [75-126] 84 (06/07 0545) Resp:  [18] 18 (06/07 0545) BP: (134-183)/(73-90) 134/73 mmHg (06/07 0545) SpO2:  [91 %-98 %] 91 % (06/07 0545) Weight:  [113.399 kg (250 lb)] 113.399 kg (250  lb) (06/07 0129) Physical Exam Constitutional: He is oriented to person, place, and time. He appears well-developed and well-nourished. No distress.  HENT:  Head: Normocephalic and atraumatic.  Cardiovascular: Normal rate, regular rhythm and normal heart sounds.  No murmur heard. Pulmonary/Chest: Effort normal and breath sounds normal. No respiratory distress. He has no wheezes.  Abdominal: Soft. Bowel sounds are normal. There is no tenderness. There is no rebound and no guarding.  Genitourinary:  No CVA tenderness  Musculoskeletal: He exhibits no edema.  Neurological: He is alert and  oriented to person, place, and time.  Skin: Skin is warm and dry.  Psychiatric: He has a normal mood and affect.  Laboratory Data:  Results for orders placed or performed during the hospital encounter of 12/03/15 (from the past 72 hour(s))  Basic metabolic panel     Status: Abnormal   Collection Time: 12/03/15  1:52 AM  Result Value Ref Range   Sodium 136 135 - 145 mmol/L   Potassium 3.6 3.5 - 5.1 mmol/L   Chloride 102 101 - 111 mmol/L   CO2 23 22 - 32 mmol/L   Glucose, Bld 144 (H) 65 - 99 mg/dL   BUN 19 6 - 20 mg/dL   Creatinine, Ser 2.17 (H) 0.61 - 1.24 mg/dL   Calcium 9.5 8.9 - 10.3 mg/dL   GFR calc non Af Amer 29 (L) >60 mL/min   GFR calc Af Amer 34 (L) >60 mL/min    Comment: (NOTE) The eGFR has been calculated using the CKD EPI equation. This calculation has not been validated in all clinical situations. eGFR's persistently <60 mL/min signify possible Chronic Kidney Disease.    Anion gap 11 5 - 15  CBC     Status: Abnormal   Collection Time: 12/03/15  1:52 AM  Result Value Ref Range   WBC 13.8 (H) 4.0 - 10.5 K/uL   RBC 5.59 4.22 - 5.81 MIL/uL   Hemoglobin 15.5 13.0 - 17.0 g/dL   HCT 47.4 39.0 - 52.0 %   MCV 84.8 78.0 - 100.0 fL   MCH 27.7 26.0 - 34.0 pg   MCHC 32.7 30.0 - 36.0 g/dL   RDW 14.3 11.5 - 15.5 %   Platelets 157 150 - 400 K/uL  Urinalysis, Routine w reflex microscopic- may I&O cath if menses     Status: Abnormal   Collection Time: 12/03/15  1:53 AM  Result Value Ref Range   Color, Urine YELLOW YELLOW   APPearance CLEAR CLEAR   Specific Gravity, Urine 1.026 1.005 - 1.030   pH 5.5 5.0 - 8.0   Glucose, UA NEGATIVE NEGATIVE mg/dL   Hgb urine dipstick LARGE (A) NEGATIVE   Bilirubin Urine NEGATIVE NEGATIVE   Ketones, ur 15 (A) NEGATIVE mg/dL   Protein, ur NEGATIVE NEGATIVE mg/dL   Nitrite NEGATIVE NEGATIVE   Leukocytes, UA NEGATIVE NEGATIVE  Urine microscopic-add on     Status: Abnormal   Collection Time: 12/03/15  1:53 AM  Result Value Ref Range    Squamous Epithelial / LPF 0-5 (A) NONE SEEN   WBC, UA 0-5 0 - 5 WBC/hpf   RBC / HPF 6-30 0 - 5 RBC/hpf   Bacteria, UA RARE (A) NONE SEEN   No results found for this or any previous visit (from the past 240 hour(s)). Creatinine:  Recent Labs  12/01/15 1008 12/03/15 0152  CREATININE 1.65* 2.17*   Baseline Creatinine:  CLINICAL DATA: Acute onset of severe abdominal and flank pain.  EXAM: CT ANGIOGRAPHY ABDOMEN  TECHNIQUE: Multidetector CT imaging of the abdomen was performed using the standard protocol during bolus administration of intravenous contrast. Multiplanar reconstructed images including MIPs were obtained and reviewed to evaluate the vascular anatomy.  CONTRAST: 80 cc Isovue 370  COMPARISON: None.  FINDINGS: Lower chest: The lung bases are clear of acute process. No pleural effusion or pulmonary lesions. The heart is normal in size. No pericardial effusion. The distal esophagus and aorta are unremarkable.  Hepatobiliary: No focal hepatic lesions or intrahepatic biliary dilatation. The gallbladder is normal. No common bile duct dilatation.  Pancreas: No mass, inflammation or ductal dilatation.  Spleen: Normal size. No focal lesions.  Adrenals/Urinary Tract: Small left adrenal gland nodule is likely a benign adenoma and not significantly changed since a prior chest CT. The right adrenal gland is normal.  There are bilateral renal calculi and mild bilateral hydronephrosis. There is a 5 mm left upper ureteral calculus. There is right-sided hydroureter and perinephric interstitial changes and fluid consistent with obstruction. However, the scan does not include the pelvis but I suspect there is a distal right ureteral calculus causing obstruction.  Stomach/Bowel: The stomach, duodenum, visualized small bowel and visualized colon are unremarkable. No inflammatory changes, mass lesions or obstructive findings.  Vascular/Lymphatic: The aorta  is normal in caliber. Minimal scattered atherosclerotic calcifications for age. No dissection. The branch vessels are patent. New line no mesenteric or retroperitoneal mass or lymphadenopathy.  Other: No ascites or abdominal wall hernia.  Musculoskeletal: Scoliosis and degenerative lumbar spondylosis but no significant or acute bony findings.  Review of the MIP images confirms the above findings.  IMPRESSION: 1. Right-sided hydroureteronephrosis most likely due to an obstructing distal ureteral calculus but this study did not include the pelvis and a stone is not identified. 2. 5 mm left upper ureteral calculus causing mild obstruction. 3. Bilateral renal calculi. 4. Normal aorta for age.   Electronically Signed  By: Marijo Sanes M.D.  On: 12/01/2015 14:20  Anabelle Bungert I Viriginia Amendola 12/03/2015, 6:18 AM

## 2015-12-03 NOTE — ED Notes (Signed)
Attempted to call report

## 2015-12-03 NOTE — H&P (Addendum)
History and Physical    James Mckinney [id number] DOB: [birth year] DOA: 12/03/2015  PCP: Haywood Pao, MD Patient coming from: Home  Chief Complaint: abd pain  HPI: James Mckinney is a 70 y.o. male with medical history significant of engine, nephrolithiasis, glaucoma. Patient states that the last time he passed a kidney stone was 3-4 years ago. This is likely his third episode of nephrolithiasis area did patient presenting with abdominal pain. Bilateral flank with radiation to center of abdomen and pelvis region. Waxing and waning nature of severity with dull constant ache. Seen in the ED 2 days ago and prescribed Flomax and Percocet. Initially this resolved his pain though it has come back stronger than before. Patient has not been straining his urine but denies any hematuria, evidence of stone in his urine, fevers, diarrhea, neck stiffness, headache, shortness breath, chest pain, palpitations. Patient does endorse nausea with the pain and a couple episodes of nonbilious nonbloody emesis.  ED Course: Patient started on IV fluids and given Dilaudid for his pain. Imaging obtained showing obstructive stones with hydronephrosis. Urology consult requesting medicine admission and management.  Review of Systems: As per HPI otherwise 10 point review of systems negative.   Ambulatory Status: No restrictions  Past Medical History  Diagnosis Date  . Hypertension   . Kidney stone   . Glaucoma     Past Surgical History  Procedure Laterality Date  . No past surgeries      Social History   Social History  . Marital Status: Married    Spouse Name: N/A  . Number of Children: N/A  . Years of Education: N/A   Occupational History  . Not on file.   Social History Main Topics  . Smoking status: Never Smoker   . Smokeless tobacco: Never Used  . Alcohol Use: No  . Drug Use: No  . Sexual Activity: Not on file   Other Topics Concern  . Not on file   Social History Narrative      reports that he has never smoked. He has never used smokeless tobacco. He reports that he does not drink alcohol or use illicit drugs.  No Known Allergies  Family History  Problem Relation Age of Onset  . Cancer Mother     stomach  . Diabetes Father   . Cancer Father     lung - smoker  . Cancer Brother     Prior to Admission medications   Medication Sig Start Date End Date Taking? Authorizing Provider  dorzolamide (TRUSOPT) 2 % ophthalmic solution Place 1 drop into both eyes 2 (two) times daily.  10/15/15  Yes Historical Provider, MD  latanoprost (XALATAN) 0.005 % ophthalmic solution Place 1 drop into both eyes at bedtime.  10/15/15  Yes Historical Provider, MD  lisinopril-hydrochlorothiazide (PRINZIDE,ZESTORETIC) 20-12.5 MG tablet Take 1 tablet by mouth daily. 11/27/15  Yes Historical Provider, MD  ondansetron (ZOFRAN) 4 MG tablet Take 1 tablet (4 mg total) by mouth every 6 (six) hours. 12/01/15  Yes Comer Locket, PA-C  oxyCODONE-acetaminophen (PERCOCET/ROXICET) 5-325 MG tablet Take 1-2 tablets by mouth every 4 (four) hours as needed for severe pain. 12/01/15  Yes Comer Locket, PA-C  tamsulosin (FLOMAX) 0.4 MG CAPS capsule Take 1 capsule (0.4 mg total) by mouth 2 (two) times daily. 12/01/15  Yes Comer Locket, PA-C    Physical Exam: Filed Vitals:   12/03/15 0700 12/03/15 0715 12/03/15 0802 12/03/15 0815  BP: 132/64 135/75 149/75 132/89  Pulse: 79 75 85 77  Temp:      TempSrc:      Resp:      Height:      Weight:      SpO2: 96% 94% 96% 90%     General:  Appears calm and comfortable Eyes:  PERRL, EOMI, normal lids, iris ENT:  grossly normal hearing, lips & tongue, mmm Neck:  no LAD, masses or thyromegaly Cardiovascular:  RRR, no m/r/g. No LE edema.  Respiratory:  CTA bilaterally, no w/r/r. Normal respiratory effort. Abdomen:  soft, ntnd, NABS Skin:  no rash or induration seen on limited exam Musculoskeletal:  grossly normal tone BUE/BLE, good ROM, no bony  abnormality Psychiatric:  grossly normal mood and affect, speech fluent and appropriate, AOx3 Neurologic:  CN 2-12 grossly intact, moves all extremities in coordinated fashion, sensation intact  Labs on Admission: I have personally reviewed following labs and imaging studies  CBC:  Recent Labs Lab 12/01/15 1008 12/03/15 0152  WBC 11.6* 13.8*  HGB 16.3 15.5  HCT 49.6 47.4  MCV 85.2 84.8  PLT 174 [id number]   Basic Metabolic Panel:  Recent Labs Lab 12/01/15 1008 12/03/15 0152  NA 141 136  K 3.8 3.6  CL 107 102  CO2 27 23  GLUCOSE 120* 144*  BUN 11 19  CREATININE 1.65* 2.17*  CALCIUM 9.4 9.5   GFR: Estimated Creatinine Clearance: 41.8 mL/min (by C-G formula based on Cr of 2.17). Liver Function Tests:  Recent Labs Lab 12/01/15 1008  AST 23  ALT 17  ALKPHOS 70  BILITOT 1.2  PROT 7.2  ALBUMIN 3.6    Recent Labs Lab 12/01/15 1008  LIPASE 28   No results for input(s): AMMONIA in the last 168 hours. Coagulation Profile: No results for input(s): INR, PROTIME in the last 168 hours. Cardiac Enzymes: No results for input(s): CKTOTAL, CKMB, CKMBINDEX, TROPONINI in the last 168 hours. BNP (last 3 results) No results for input(s): PROBNP in the last 8760 hours. HbA1C: No results for input(s): HGBA1C in the last 72 hours. CBG: No results for input(s): GLUCAP in the last 168 hours. Lipid Profile: No results for input(s): CHOL, HDL, LDLCALC, TRIG, CHOLHDL, LDLDIRECT in the last 72 hours. Thyroid Function Tests: No results for input(s): TSH, T4TOTAL, FREET4, T3FREE, THYROIDAB in the last 72 hours. Anemia Panel: No results for input(s): VITAMINB12, FOLATE, FERRITIN, TIBC, IRON, RETICCTPCT in the last 72 hours. Urine analysis:    Component Value Date/Time   COLORURINE YELLOW 12/03/2015 0153   APPEARANCEUR CLEAR 12/03/2015 0153   LABSPEC 1.026 12/03/2015 0153   PHURINE 5.5 12/03/2015 0153   GLUCOSEU NEGATIVE 12/03/2015 0153   HGBUR LARGE* 12/03/2015 0153   BILIRUBINUR  NEGATIVE 12/03/2015 0153   KETONESUR 15* 12/03/2015 0153   PROTEINUR NEGATIVE 12/03/2015 0153   NITRITE NEGATIVE 12/03/2015 0153   LEUKOCYTESUR NEGATIVE 12/03/2015 0153    Creatinine Clearance: Estimated Creatinine Clearance: 41.8 mL/min (by C-G formula based on Cr of 2.17).  Sepsis Labs: @LABRCNTIP (procalcitonin:4,lacticidven:4) )No results found for this or any previous visit (from the past 240 hour(s)).   Radiological Exams on Admission: Dg Abd 1 View  12/03/2015  CLINICAL DATA:  Right flank pain for 2-3 days.  Nausea. EXAM: ABDOMEN - 1 VIEW COMPARISON:  Abdominal CT 12/01/2015.  Abdomen pelvis CT 03/16/2006 FINDINGS: A right pelvic calcification does not perfectly correlate with phlebolith 2007 CT. There is a 5 mm stone over the left flank correlating with left nephrolithiasis. Right renal calculus on comparison CT is not seen but could be obscured by extensive  gas and stool over the right flank. Nonobstructive bowel gas pattern. No concerning intra-abdominal mass effect. IMPRESSION: 1. Left nephrolithiasis near the UPJ, position similar to 12/01/2015 abdominal CT. 2. 3 mm right pelvic ureteral calculus versus phlebolith. Electronically Signed   By: Monte Fantasia M.D.   On: 12/03/2015 05:09   Ct Angio Abdomen W/cm &/or Wo Contrast  12/01/2015  CLINICAL DATA:  Acute onset of severe abdominal and flank pain. EXAM: CT ANGIOGRAPHY ABDOMEN TECHNIQUE: Multidetector CT imaging of the abdomen was performed using the standard protocol during bolus administration of intravenous contrast. Multiplanar reconstructed images including MIPs were obtained and reviewed to evaluate the vascular anatomy. CONTRAST:  80 cc Isovue 370 COMPARISON:  None. FINDINGS: Lower chest: The lung bases are clear of acute process. No pleural effusion or pulmonary lesions. The heart is normal in size. No pericardial effusion. The distal esophagus and aorta are unremarkable. Hepatobiliary: No focal hepatic lesions or intrahepatic  biliary dilatation. The gallbladder is normal. No common bile duct dilatation. Pancreas: No mass, inflammation or ductal dilatation. Spleen: Normal size.  No focal lesions. Adrenals/Urinary Tract: Small left adrenal gland nodule is likely a benign adenoma and not significantly changed since a prior chest CT. The right adrenal gland is normal. There are bilateral renal calculi and mild bilateral hydronephrosis. There is a 5 mm left upper ureteral calculus. There is right-sided hydroureter and perinephric interstitial changes and fluid consistent with obstruction. However, the scan does not include the pelvis but I suspect there is a distal right ureteral calculus causing obstruction. Stomach/Bowel: The stomach, duodenum, visualized small bowel and visualized colon are unremarkable. No inflammatory changes, mass lesions or obstructive findings. Vascular/Lymphatic: The aorta is normal in caliber. Minimal scattered atherosclerotic calcifications for age. No dissection. The branch vessels are patent. New line no mesenteric or retroperitoneal mass or lymphadenopathy. Other: No ascites or abdominal wall hernia. Musculoskeletal: Scoliosis and degenerative lumbar spondylosis but no significant or acute bony findings. Review of the MIP images confirms the above findings. IMPRESSION: 1. Right-sided hydroureteronephrosis most likely due to an obstructing distal ureteral calculus but this study did not include the pelvis and a stone is not identified. 2. 5 mm left upper ureteral calculus causing mild obstruction. 3. Bilateral renal calculi. 4. Normal aorta for age. Electronically Signed   By: Marijo Sanes M.D.   On: 12/01/2015 14:20   US Renal  12/03/2015  CLINICAL DATA:  Kidney stones EXAM: RENAL / URINARY TRACT ULTRASOUND COMPLETE COMPARISON:  Abdominal CT 2 days ago FINDINGS: Right Kidney: Length: 12 cm. No hydronephrosis. No evidence solid mass. No perinephric collection. Lower pole calculus seen on comparison CT. Left  Kidney: Length: 12.6 cm. No hydronephrosis. Renal pelvis stone seen on comparison CT is not visualized. No evidence of solid mass. Bladder: 45 mm projecting into the bladder base has central divot and is likely the enlarged prostate seen by 2007 abdominal CT. IMPRESSION: 1. Known nephrolithiasis.  No hydronephrosis. 2. Mass projecting into the lower bladder is almost certainly prostatomegaly, but is partially visualized. If no recent outside comparison imaging, CT could confirm. Electronically Signed   By: Monte Fantasia M.D.   On: 12/03/2015 04:55   Ct Renal Stone Study  12/03/2015  CLINICAL DATA:  Bilateral flank pain for 6 days.  Nephrolithiasis. EXAM: CT ABDOMEN AND PELVIS WITHOUT CONTRAST TECHNIQUE: Multidetector CT imaging of the abdomen and pelvis was performed following the standard protocol without IV contrast. COMPARISON:  Abdomen CTA on 12/01/2015 FINDINGS: Lower chest:  No acute findings.  Bibasilar scarring again noted. Hepatobiliary: No mass visualized on this un-enhanced exam. Gallbladder contains contrast from vicarious excretion from recent abdomen CTA but is otherwise unremarkable in appearance. Pancreas: No mass or inflammatory process identified on this un-enhanced exam. Spleen: Within normal limits in size. Adrenals/Urinary Tract: Adrenal glands appear normal. 5 mm proximal left ureteral calculus remains stable in location pain causes mild left hydronephrosis, which is not significant changed. A 3 mm nonobstructive calculus is again seen in the lower pole of the right kidney. Mild right hydroureteronephrosis is stable. A 3 mm obstructing calculus is seen at the right ureterovesical junction. Stomach/Bowel: No evidence of obstruction, inflammatory process, or abnormal fluid collections. Vascular/Lymphatic: No pathologically enlarged lymph nodes. No evidence of abdominal aortic aneurysm. Reproductive: Moderately enlarged prostate gland is seen with mass effect on bladder base. Other: None.  Musculoskeletal: No suspicious bone lesions identified. Advanced lumbar degenerative spondylosis noted. IMPRESSION: Stable mild left hydronephrosis due to 5 mm calculus in proximal left ureter. Stable mild right hydroureteronephrosis, due to 3 mm distal right ureteral calculus at the ureterovesical junction. Moderately enlarged prostate. Electronically Signed   By: Earle Gell M.D.   On: 12/03/2015 08:08     Assessment/Plan Active Problems:   Nephrolithiasis   Ureteral stone with hydronephrosis   Essential hypertension   Glaucoma   AKI (acute kidney injury) ([place])   Leukocytosis   Constipation    Nephrolithiasis w/ hydronephrosis: Bilateral stones. Left proximally ureteral 5 mm stone with right 3 mm distal ureteral stone at the ureterovesical junction. Bilateral hydronephrosis. Patient with history of nephrolithiasis in the past. Urology consult by EDP. Hopeful for nonoperative passage of stones with left 5 mm stone at approximately 50% chance of passage. - Medical surgical bed - 1/2NS 116ml/hr - Toradol - Flomax - Morphine when necessary - Follow-up continued urology recommendations  AKI: Cr 2.17, baseline 1.3-1.66. Likely secondary to hydronephrosis secondary to obstructive stones - IVF - Management as above - Monitor renal function closely as we are dosing Toradol for assistance with passage of stone. May need to stop the Toradol if renal function worsens - Hold lisinopril - BMET 17:00 then QAM  Hypertension: Normotensive to hypertensive likely secondary to pain - Continue HCTZ - Hold lisinopril due to AKI - Hydralazine IV when necessary  Leukocytosis: 13.8 likely reactionary from stones. No overt evidence of acute infectious process.  - CBC in am  Glaucoma: - continue Xalatan and Trusopt eye drops  Constipation: Stool noted on CT scan and pt endorses being more constipated and gassy lately. - Miralax and Senna   DVT prophylaxis: Lovenox  Code Status: FULL  Family  Communication: wife  Disposition Plan: pending improvement. Anticipate 2 day hospital stay  Consults called: Urology by EDP  Admission status: inpatient    Aiva Miskell J MD [facility]  If 7PM-7AM, please contact night-coverage [email or web address] Password [email or web address]  12/03/2015, 8:50 AM

## 2015-12-03 NOTE — ED Notes (Signed)
Dr. Merrell MD at bedside. 

## 2015-12-04 ENCOUNTER — Observation Stay (HOSPITAL_COMMUNITY): Payer: Medicare Other

## 2015-12-04 DIAGNOSIS — N179 Acute kidney failure, unspecified: Secondary | ICD-10-CM | POA: Diagnosis not present

## 2015-12-04 DIAGNOSIS — K5900 Constipation, unspecified: Secondary | ICD-10-CM | POA: Diagnosis not present

## 2015-12-04 DIAGNOSIS — I10 Essential (primary) hypertension: Secondary | ICD-10-CM

## 2015-12-04 DIAGNOSIS — N200 Calculus of kidney: Secondary | ICD-10-CM

## 2015-12-04 LAB — CBC
HCT: 42 % (ref 39.0–52.0)
Hemoglobin: 13.1 g/dL (ref 13.0–17.0)
MCH: 27.2 pg (ref 26.0–34.0)
MCHC: 31.2 g/dL (ref 30.0–36.0)
MCV: 87.3 fL (ref 78.0–100.0)
PLATELETS: 109 10*3/uL — AB (ref 150–400)
RBC: 4.81 MIL/uL (ref 4.22–5.81)
RDW: 14.4 % (ref 11.5–15.5)
WBC: 8.4 10*3/uL (ref 4.0–10.5)

## 2015-12-04 LAB — BASIC METABOLIC PANEL
Anion gap: 6 (ref 5–15)
BUN: 19 mg/dL (ref 6–20)
CALCIUM: 8.5 mg/dL — AB (ref 8.9–10.3)
CO2: 27 mmol/L (ref 22–32)
CREATININE: 2.16 mg/dL — AB (ref 0.61–1.24)
Chloride: 103 mmol/L (ref 101–111)
GFR, EST AFRICAN AMERICAN: 34 mL/min — AB (ref >60)
GFR, EST NON AFRICAN AMERICAN: 29 mL/min — AB (ref >60)
Glucose, Bld: 93 mg/dL (ref 65–99)
Potassium: 3.7 mmol/L (ref 3.5–5.1)
SODIUM: 136 mmol/L (ref 135–145)

## 2015-12-04 MED ORDER — SALINE SPRAY 0.65 % NA SOLN
1.0000 | NASAL | Status: DC | PRN
  Filled 2015-12-04: qty 44

## 2015-12-04 NOTE — Care Management Obs Status (Signed)
[facility] NOTIFICATION   Patient Details  Name: James Mckinney MRN: [id number] Date of Birth: 29-Jan-1946   Medicare Observation Status Notification Given:  Yes    Sharin Mons, RN 12/04/2015, 11:10 AM

## 2015-12-04 NOTE — Progress Notes (Signed)
Patient ID: James Mckinney, male   DOB: 09-Mar-1946, 70 y.o.   MRN: [id number]  Assessment: Bilateral ureteral calculi - he notes he is not having any further pain and continues to make a good amount of urine which means he is not obstructed. His creatinine remains elevated and this could be due to partial obstruction however his electrolytes look fine and with no pain there is no immediate need for surgical intervention. He has been having his urine strained and has not seen a stone. He remains on an alpha-blocker for medical expulsive therapy. He has passed stones in the past.  Plan: 1. Continue tamsulosin for medical expulsive therapy. 2. Continue straining all urine. 3. Obtain KUB. 4. May eventually need surgical intervention if he does not pass his stones.    Subjective: Patient reports he is feeling better with no pain at this time. He has not seen a stone pass. He continues to make a normal amount of urine. He denies any new voiding symptoms.  Objective: Vital signs in last 24 hours: Temp:  [97.9 F (36.6 C)-98.4 F (36.9 C)] 97.9 F (36.6 C) (06/08 1432) Pulse Rate:  [60-79] 79 (06/08 1432) Resp:  [17-19] 19 (06/08 1432) BP: (112-129)/(53-60) 129/59 mmHg (06/08 1432) SpO2:  [99 %] 99 % (06/08 1432)A  Intake/Output from previous day: 06/07 0701 - 06/08 0700 In: 1980.8 [P.O.:662; I.V.:1318.8] Out: 625 [Urine:625] Intake/Output this shift: Total I/O In: 850 [P.O.:850] Out: 850 [Urine:850]  Past Medical History  Diagnosis Date  . Hypertension   . Kidney stone   . Glaucoma   . Headache   . Arthritis   . Nephrolithiasis 11/2015    Physical Exam:  Lungs - Normal respiratory effort, chest expands symmetrically.  Abdomen - Soft, non-tender & non-distended. Back - no significant CVAT.  Lab Results:  Recent Labs  12/03/15 0152 12/04/15 0530  WBC 13.8* 8.4  HGB 15.5 13.1  HCT 47.4 42.0   BMET  Recent Labs  12/03/15 1613 12/04/15 0530  NA 138 136  K 3.7 3.7   CL 105 103  CO2 28 27  GLUCOSE 101* 93  BUN 18 19  CREATININE 2.26* 2.16*  CALCIUM 8.6* 8.5*   No results for input(s): LABURIN in the last 72 hours. No results found for this or any previous visit.  Studies/Results: No results found.    Claybon Jabs 12/04/2015, 5:08 PM

## 2015-12-04 NOTE — Progress Notes (Signed)
PROGRESS NOTE    James Mckinney  [id number] DOB: 03/12/1946 DOA: 12/03/2015 PCP: Haywood Pao, MD )   Brief Narrative: 70 year old admitted for nephrolithiasis.  Assessment & Plan:   Active Problems:   Nephrolithiasis   Ureteral stone with hydronephrosis   Essential hypertension   Glaucoma   AKI (acute kidney injury) ([place])   Leukocytosis   Constipation   Nephrolithiasis with acute kidney injury and hydronephrosis: Aggressive fluids,  Urology consult,  Monitor renal parameters.  Medical expulsive therapy.  Currently pain free.    Hypertension: Better controlled.   Constipation: Stool softeners,    Leukocytosis: No infection seen so far.    DVT prophylaxis: (Lovenox/ Code Status: (Full Family Communication: none at bedside Disposition Plan: pending further management.    Consultants:  urology  Procedures: none  Antimicrobials: none  Subjective: Wants to go home Objective: Filed Vitals:   12/03/15 1312 12/03/15 2051 12/04/15 0513 12/04/15 1432  BP: 117/59 125/60 112/53 129/59  Pulse: 68 60  79  Temp: 98.1 F (36.7 C) 98.4 F (36.9 C) 98.3 F (36.8 C) 97.9 F (36.6 C)  TempSrc: Oral     Resp: 16 19 17 19   Height:      Weight:      SpO2: 96%   99%    Intake/Output Summary (Last 24 hours) at 12/04/15 1742 Last data filed at 12/04/15 1721  Gross per 24 hour  Intake 2168.75 ml  Output   1352 ml  Net 816.75 ml   Filed Weights   12/03/15 0129 12/03/15 0910  Weight: 113.399 kg (250 lb) 110.723 kg (244 lb 1.6 oz)    Examination:  General exam: Appears calm and comfortable  Respiratory system: Clear to auscultation. Respiratory effort normal. Cardiovascular system: S1 & S2 heard, RRR. No JVD, murmurs, rubs, gallops or clicks. No pedal edema. Gastrointestinal system: Abdomen is nondistended, soft and nontender. No organomegaly or masses felt. Normal bowel sounds heard. Central nervous system: Alert and oriented. No focal  neurological deficits. Extremities: Symmetric 5 x 5 power. Skin: No rashes, lesions or ulcers Psychiatry: Judgement and insight appear normal. Mood & affect appropriate.     Data Reviewed: I have personally reviewed following labs and imaging studies  CBC:  Recent Labs Lab 12/01/15 1008 12/03/15 0152 12/04/15 0530  WBC 11.6* 13.8* 8.4  HGB 16.3 15.5 13.1  HCT 49.6 47.4 42.0  MCV 85.2 84.8 87.3  PLT 174 157 0000000*   Basic Metabolic Panel:  Recent Labs Lab 12/01/15 1008 12/03/15 0152 12/03/15 1613 12/04/15 0530  NA 141 136 138 136  K 3.8 3.6 3.7 3.7  CL 107 102 105 103  CO2 27 23 28 27   GLUCOSE 120* 144* 101* 93  BUN 11 19 18 19   CREATININE 1.65* 2.17* 2.26* 2.16*  CALCIUM 9.4 9.5 8.6* 8.5*   GFR: Estimated Creatinine Clearance: 41.5 mL/min (by C-G formula based on Cr of 2.16). Liver Function Tests:  Recent Labs Lab 12/01/15 1008  AST 23  ALT 17  ALKPHOS 70  BILITOT 1.2  PROT 7.2  ALBUMIN 3.6    Recent Labs Lab 12/01/15 1008  LIPASE 28   No results for input(s): AMMONIA in the last 168 hours. Coagulation Profile: No results for input(s): INR, PROTIME in the last 168 hours. Cardiac Enzymes: No results for input(s): CKTOTAL, CKMB, CKMBINDEX, TROPONINI in the last 168 hours. BNP (last 3 results) No results for input(s): PROBNP in the last 8760 hours. HbA1C: No results for input(s): HGBA1C in  the last 72 hours. CBG: No results for input(s): GLUCAP in the last 168 hours. Lipid Profile: No results for input(s): CHOL, HDL, LDLCALC, TRIG, CHOLHDL, LDLDIRECT in the last 72 hours. Thyroid Function Tests: No results for input(s): TSH, T4TOTAL, FREET4, T3FREE, THYROIDAB in the last 72 hours. Anemia Panel: No results for input(s): VITAMINB12, FOLATE, FERRITIN, TIBC, IRON, RETICCTPCT in the last 72 hours. Sepsis Labs: No results for input(s): PROCALCITON, LATICACIDVEN in the last 168 hours.  No results found for this or any previous visit (from the past  240 hour(s)).       Radiology Studies: Dg Abd 1 View  12/03/2015  CLINICAL DATA:  Right flank pain for 2-3 days.  Nausea. EXAM: ABDOMEN - 1 VIEW COMPARISON:  Abdominal CT 12/01/2015.  Abdomen pelvis CT 03/16/2006 FINDINGS: A right pelvic calcification does not perfectly correlate with phlebolith 2007 CT. There is a 5 mm stone over the left flank correlating with left nephrolithiasis. Right renal calculus on comparison CT is not seen but could be obscured by extensive gas and stool over the right flank. Nonobstructive bowel gas pattern. No concerning intra-abdominal mass effect. IMPRESSION: 1. Left nephrolithiasis near the UPJ, position similar to 12/01/2015 abdominal CT. 2. 3 mm right pelvic ureteral calculus versus phlebolith. Electronically Signed   By: Monte Fantasia M.D.   On: 12/03/2015 05:09   US Renal  12/03/2015  CLINICAL DATA:  Kidney stones EXAM: RENAL / URINARY TRACT ULTRASOUND COMPLETE COMPARISON:  Abdominal CT 2 days ago FINDINGS: Right Kidney: Length: 12 cm. No hydronephrosis. No evidence solid mass. No perinephric collection. Lower pole calculus seen on comparison CT. Left Kidney: Length: 12.6 cm. No hydronephrosis. Renal pelvis stone seen on comparison CT is not visualized. No evidence of solid mass. Bladder: 45 mm projecting into the bladder base has central divot and is likely the enlarged prostate seen by 2007 abdominal CT. IMPRESSION: 1. Known nephrolithiasis.  No hydronephrosis. 2. Mass projecting into the lower bladder is almost certainly prostatomegaly, but is partially visualized. If no recent outside comparison imaging, CT could confirm. Electronically Signed   By: Monte Fantasia M.D.   On: 12/03/2015 04:55   Ct Renal Stone Study  12/03/2015  CLINICAL DATA:  Bilateral flank pain for 6 days.  Nephrolithiasis. EXAM: CT ABDOMEN AND PELVIS WITHOUT CONTRAST TECHNIQUE: Multidetector CT imaging of the abdomen and pelvis was performed following the standard protocol without IV contrast.  COMPARISON:  Abdomen CTA on 12/01/2015 FINDINGS: Lower chest:  No acute findings.  Bibasilar scarring again noted. Hepatobiliary: No mass visualized on this un-enhanced exam. Gallbladder contains contrast from vicarious excretion from recent abdomen CTA but is otherwise unremarkable in appearance. Pancreas: No mass or inflammatory process identified on this un-enhanced exam. Spleen: Within normal limits in size. Adrenals/Urinary Tract: Adrenal glands appear normal. 5 mm proximal left ureteral calculus remains stable in location pain causes mild left hydronephrosis, which is not significant changed. A 3 mm nonobstructive calculus is again seen in the lower pole of the right kidney. Mild right hydroureteronephrosis is stable. A 3 mm obstructing calculus is seen at the right ureterovesical junction. Stomach/Bowel: No evidence of obstruction, inflammatory process, or abnormal fluid collections. Vascular/Lymphatic: No pathologically enlarged lymph nodes. No evidence of abdominal aortic aneurysm. Reproductive: Moderately enlarged prostate gland is seen with mass effect on bladder base. Other: None. Musculoskeletal: No suspicious bone lesions identified. Advanced lumbar degenerative spondylosis noted. IMPRESSION: Stable mild left hydronephrosis due to 5 mm calculus in proximal left ureter. Stable mild right hydroureteronephrosis, due to  3 mm distal right ureteral calculus at the ureterovesical junction. Moderately enlarged prostate. Electronically Signed   By: Earle Gell M.D.   On: 12/03/2015 08:08        Scheduled Meds: . dorzolamide  1 drop Both Eyes BID  . enoxaparin (LOVENOX) injection  40 mg Subcutaneous Q24H  . latanoprost  1 drop Both Eyes QHS  . senna  1 tablet Oral BID  . tamsulosin  0.4 mg Oral BID   Continuous Infusions: . sodium chloride 125 mL/hr at 12/04/15 1229        Time spent: 25 minutes.    Hosie Poisson, MD [facility] Pager [phone]  If 7PM-7AM, please contact  night-coverage [email or web address] Password [facility] 12/04/2015, 5:42 PM

## 2015-12-05 DIAGNOSIS — K5900 Constipation, unspecified: Secondary | ICD-10-CM | POA: Diagnosis not present

## 2015-12-05 DIAGNOSIS — N200 Calculus of kidney: Secondary | ICD-10-CM | POA: Diagnosis not present

## 2015-12-05 DIAGNOSIS — I10 Essential (primary) hypertension: Secondary | ICD-10-CM | POA: Diagnosis not present

## 2015-12-05 DIAGNOSIS — N179 Acute kidney failure, unspecified: Secondary | ICD-10-CM | POA: Diagnosis not present

## 2015-12-05 LAB — CREATININE, SERUM
CREATININE: 1.33 mg/dL — AB (ref 0.61–1.24)
GFR calc Af Amer: >60 mL/min (ref >60)
GFR, EST NON AFRICAN AMERICAN: 53 mL/min — AB (ref >60)

## 2015-12-05 MED ORDER — OXYCODONE-ACETAMINOPHEN 5-325 MG PO TABS: 1.0000 | ORAL_TABLET | ORAL | Status: DC | PRN

## 2015-12-05 NOTE — Progress Notes (Signed)
  Assessment: Bilateral UVJ stones, 61mm L side and 63mm Right side. No pain today. Pt has pssed stones before.  Making normal volume urine. ( unobstructed). Needs repeat Cr.   Plan: 1. Repeat Cr.           2.  If unchanged or improved, then ok for d/c with Flomax, strainer, and RTC next week, ( James Mckin[facility])     Subjective: Patient reports voiding well. No pain. Wants d/c with strainer. Has pssed stones before. Will go to [facility]ney develops stone pain over weekend.   Objective: Vital signs in last 24 hours: Temp:  [97.9 F (36.6 C)-99.6 F (37.6 C)] 98.6 F (37 C) (06/09 0557) Pulse Rate:  [64-79] 70 (06/09 0557) Resp:  [18-20] 20 (06/09 0557) BP: (124-136)/(56-76) 136/76 mmHg (06/09 0557) SpO2:  [93 %-99 %] 93 % (06/09 0557)A  Intake/Output from previous day: 06/08 0701 - 06/09 0700 In: 2814.7 [P.O.:850; I.V.:1964.7] Out: 2152 [Urine:2150; Stool:2] Intake/Output this shift:    Past Medical History  Diagnosis Date  . Hypertension   . Kidney stone   . Glaucoma   . Headache   . Arthritis   . Nephrolithiasis 11/2015    Physical Exam:  Lungs - Normal respiratory effort, chest expands symmetrically.  Abdomen - Soft, non-tender & non-distended.  Lab Results:  Recent Labs  12/03/15 0152 12/04/15 0530  WBC 13.8* 8.4  HGB 15.5 13.1  HCT 47.4 42.0   BMET  Recent Labs  12/03/15 1613 12/04/15 0530  NA 138 136  K 3.7 3.7  CL 105 103  CO2 28 27  GLUCOSE 101* 93  BUN 18 19  CREATININE 2.26* 2.16*  CALCIUM 8.6* 8.5*   No results for input(s): LABURIN in the last 72 hours. No results found for this or any previous visit.  Studies/Results: Dg Abd 1 View  12/04/2015  CLINICAL DATA:  Bilateral ureteral calculi and hydronephrosis. EXAM: ABDOMEN - 1 VIEW COMPARISON:  CT on 12/03/2015 FINDINGS: No evidence of dilated bowel loops. A 4 mm calcification is seen in the left pelvis near the expected location of the ureterovesical junction, which may represent a distal left  ureteral calculus which was previously seen in the proximal left ureter. There is also a 3 mm calcification in the right pelvis near the expected location of the ureterovesical junction, consistent with the tiny distal right ureteral calculus seen on prior study. IMPRESSION: 3 mm distal right ureteral calculus near the ureterovesical junction, as seen on recent CT. 4 mm calcification in left pelvis near the ureterovesical junction, which may represent distal migration of the left ureteral calculus seen in the proximal ureter on most recent CT. Electronically Signed   By: Earle Gell M.D.   On: 12/04/2015 17:53      Tomiko Schoon I Danaka Llera 12/05/2015, 8:40 AM

## 2015-12-08 NOTE — Discharge Summary (Signed)
Physician Discharge Summary  James Mckinney [id number] DOB: [birth year] DOA: 12/03/2015  PCP: Haywood Pao, MD  Admit date: 12/03/2015 Discharge date: 12/05/2015  Admitted From:(Home, Disposition:  (Home  Recommendations for Outpatient Follow-up:  1. Follow up with PCP in 1-2 weeks 2. Please obtain BMP/CBC in one week 3. Please follow up with urology as re commended.    Discharge Condition:stable.  CODE STATUS:full  Diet recommendation: Heart Healthy   Brief/Interim Summary: James Mckinney is a 70 y.o. male with medical history significant of engine, nephrolithiasis, admitted for abdominal pain, AKI, and hydronephrosis.  Discharge Diagnoses:  Active Problems:   Nephrolithiasis   Ureteral stone with hydronephrosis   Essential hypertension   Glaucoma   AKI (acute kidney injury) ([place])   Leukocytosis   Constipation  Nephrolithiasis with acute kidney injury and hydronephrosis:  Urology consult and recommendations given.  Improved renal parameters with aggressive fluids. Recommend checking BMP in one week at urology office or PCP office.  Medical expulsive therapy.  Currently pain free.    Hypertension: Better controlled.   Constipation: Resolved with Stool softeners,    Leukocytosis: No infection seen so far.   Discharge Instructions  Discharge Instructions    Diet - low sodium heart healthy    Complete by:  As directed      Discharge instructions    Complete by:  As directed   Follow up with PCP in one week.  Follow up with urology as recommended.  We have stopped your blood pressure medication, follow up with your PCP, in one week to resume it.            Medication List    STOP taking these medications        lisinopril-hydrochlorothiazide 20-12.5 MG tablet  Commonly known as:  PRINZIDE,ZESTORETIC      TAKE these medications        dorzolamide 2 % ophthalmic solution  Commonly known as:  TRUSOPT  Place 1 drop into both eyes 2 (two)  times daily.     latanoprost 0.005 % ophthalmic solution  Commonly known as:  XALATAN  Place 1 drop into both eyes at bedtime.     ondansetron 4 MG tablet  Commonly known as:  ZOFRAN  Take 1 tablet (4 mg total) by mouth every 6 (six) hours.     oxyCODONE-acetaminophen 5-325 MG tablet  Commonly known as:  PERCOCET/ROXICET  Take 1 tablet by mouth every 4 (four) hours as needed for severe pain.     tamsulosin 0.4 MG Caps capsule  Commonly known as:  FLOMAX  Take 1 capsule (0.4 mg total) by mouth 2 (two) times daily.           Follow-up Information    Follow up with Haywood Pao, MD. Schedule an appointment as soon as possible for a visit in 1 week.   Specialty:  Internal Medicine   Why:  Left message with Doctor office they will call patient at home with an appointment time and date   Contact information:   [place] [address] [phone]       Follow up with Ailene Rud, MD.   Specialty:  Urology   Why:  follow up as recommended.    Contact information:   [address] [place] [address] [phone]      No Known Allergies  Consultations:  urology   Procedures/Studies: Dg Abd 1 View  12/04/2015  CLINICAL DATA:  Bilateral ureteral calculi and  hydronephrosis. EXAM: ABDOMEN - 1 VIEW COMPARISON:  CT on 12/03/2015 FINDINGS: No evidence of dilated bowel loops. A 4 mm calcification is seen in the left pelvis near the expected location of the ureterovesical junction, which may represent a distal left ureteral calculus which was previously seen in the proximal left ureter. There is also a 3 mm calcification in the right pelvis near the expected location of the ureterovesical junction, consistent with the tiny distal right ureteral calculus seen on prior study. IMPRESSION: 3 mm distal right ureteral calculus near the ureterovesical junction, as seen on recent CT. 4 mm calcification in left pelvis near the ureterovesical junction, which may  represent distal migration of the left ureteral calculus seen in the proximal ureter on most recent CT. Electronically Signed   By: Earle Gell M.D.   On: 12/04/2015 17:53   Dg Abd 1 View  12/03/2015  CLINICAL DATA:  Right flank pain for 2-3 days.  Nausea. EXAM: ABDOMEN - 1 VIEW COMPARISON:  Abdominal CT 12/01/2015.  Abdomen pelvis CT 03/16/2006 FINDINGS: A right pelvic calcification does not perfectly correlate with phlebolith 2007 CT. There is a 5 mm stone over the left flank correlating with left nephrolithiasis. Right renal calculus on comparison CT is not seen but could be obscured by extensive gas and stool over the right flank. Nonobstructive bowel gas pattern. No concerning intra-abdominal mass effect. IMPRESSION: 1. Left nephrolithiasis near the UPJ, position similar to 12/01/2015 abdominal CT. 2. 3 mm right pelvic ureteral calculus versus phlebolith. Electronically Signed   By: Monte Fantasia M.D.   On: 12/03/2015 05:09   Ct Angio Abdomen W/cm &/or Wo Contrast  12/01/2015  CLINICAL DATA:  Acute onset of severe abdominal and flank pain. EXAM: CT ANGIOGRAPHY ABDOMEN TECHNIQUE: Multidetector CT imaging of the abdomen was performed using the standard protocol during bolus administration of intravenous contrast. Multiplanar reconstructed images including MIPs were obtained and reviewed to evaluate the vascular anatomy. CONTRAST:  80 cc Isovue 370 COMPARISON:  None. FINDINGS: Lower chest: The lung bases are clear of acute process. No pleural effusion or pulmonary lesions. The heart is normal in size. No pericardial effusion. The distal esophagus and aorta are unremarkable. Hepatobiliary: No focal hepatic lesions or intrahepatic biliary dilatation. The gallbladder is normal. No common bile duct dilatation. Pancreas: No mass, inflammation or ductal dilatation. Spleen: Normal size.  No focal lesions. Adrenals/Urinary Tract: Small left adrenal gland nodule is likely a benign adenoma and not significantly  changed since a prior chest CT. The right adrenal gland is normal. There are bilateral renal calculi and mild bilateral hydronephrosis. There is a 5 mm left upper ureteral calculus. There is right-sided hydroureter and perinephric interstitial changes and fluid consistent with obstruction. However, the scan does not include the pelvis but I suspect there is a distal right ureteral calculus causing obstruction. Stomach/Bowel: The stomach, duodenum, visualized small bowel and visualized colon are unremarkable. No inflammatory changes, mass lesions or obstructive findings. Vascular/Lymphatic: The aorta is normal in caliber. Minimal scattered atherosclerotic calcifications for age. No dissection. The branch vessels are patent. New line no mesenteric or retroperitoneal mass or lymphadenopathy. Other: No ascites or abdominal wall hernia. Musculoskeletal: Scoliosis and degenerative lumbar spondylosis but no significant or acute bony findings. Review of the MIP images confirms the above findings. IMPRESSION: 1. Right-sided hydroureteronephrosis most likely due to an obstructing distal ureteral calculus but this study did not include the pelvis and a stone is not identified. 2. 5 mm left upper ureteral calculus causing mild  obstruction. 3. Bilateral renal calculi. 4. Normal aorta for age. Electronically Signed   By: Marijo Sanes M.D.   On: 12/01/2015 14:20   US Renal  12/03/2015  CLINICAL DATA:  Kidney stones EXAM: RENAL / URINARY TRACT ULTRASOUND COMPLETE COMPARISON:  Abdominal CT 2 days ago FINDINGS: Right Kidney: Length: 12 cm. No hydronephrosis. No evidence solid mass. No perinephric collection. Lower pole calculus seen on comparison CT. Left Kidney: Length: 12.6 cm. No hydronephrosis. Renal pelvis stone seen on comparison CT is not visualized. No evidence of solid mass. Bladder: 45 mm projecting into the bladder base has central divot and is likely the enlarged prostate seen by 2007 abdominal CT. IMPRESSION: 1. Known  nephrolithiasis.  No hydronephrosis. 2. Mass projecting into the lower bladder is almost certainly prostatomegaly, but is partially visualized. If no recent outside comparison imaging, CT could confirm. Electronically Signed   By: Monte Fantasia M.D.   On: 12/03/2015 04:55   Ct Renal Stone Study  12/03/2015  CLINICAL DATA:  Bilateral flank pain for 6 days.  Nephrolithiasis. EXAM: CT ABDOMEN AND PELVIS WITHOUT CONTRAST TECHNIQUE: Multidetector CT imaging of the abdomen and pelvis was performed following the standard protocol without IV contrast. COMPARISON:  Abdomen CTA on 12/01/2015 FINDINGS: Lower chest:  No acute findings.  Bibasilar scarring again noted. Hepatobiliary: No mass visualized on this un-enhanced exam. Gallbladder contains contrast from vicarious excretion from recent abdomen CTA but is otherwise unremarkable in appearance. Pancreas: No mass or inflammatory process identified on this un-enhanced exam. Spleen: Within normal limits in size. Adrenals/Urinary Tract: Adrenal glands appear normal. 5 mm proximal left ureteral calculus remains stable in location pain causes mild left hydronephrosis, which is not significant changed. A 3 mm nonobstructive calculus is again seen in the lower pole of the right kidney. Mild right hydroureteronephrosis is stable. A 3 mm obstructing calculus is seen at the right ureterovesical junction. Stomach/Bowel: No evidence of obstruction, inflammatory process, or abnormal fluid collections. Vascular/Lymphatic: No pathologically enlarged lymph nodes. No evidence of abdominal aortic aneurysm. Reproductive: Moderately enlarged prostate gland is seen with mass effect on bladder base. Other: None. Musculoskeletal: No suspicious bone lesions identified. Advanced lumbar degenerative spondylosis noted. IMPRESSION: Stable mild left hydronephrosis due to 5 mm calculus in proximal left ureter. Stable mild right hydroureteronephrosis, due to 3 mm distal right ureteral calculus at the  ureterovesical junction. Moderately enlarged prostate. Electronically Signed   By: Earle Gell M.D.   On: 12/03/2015 08:08       Subjective: No pain.   Discharge Exam: Filed Vitals:   12/04/15 2240 12/05/15 0557  BP: 124/56 136/76  Pulse: 64 70  Temp: 99.6 F (37.6 C) 98.6 F (37 C)  Resp: 18 20   Filed Vitals:   12/04/15 0513 12/04/15 1432 12/04/15 2240 12/05/15 0557  BP: 112/53 129/59 124/56 136/76  Pulse:  79 64 70  Temp: 98.3 F (36.8 C) 97.9 F (36.6 C) 99.6 F (37.6 C) 98.6 F (37 C)  TempSrc:   Oral Oral  Resp: 17 19 18 20   Height:      Weight:      SpO2:  99% 95% 93%    General: Pt is alert, awake, not in acute distress Cardiovascular: RRR, S1/S2 +, no rubs, no gallops Respiratory: CTA bilaterally, no wheezing, no rhonchi Abdominal: Soft, NT, ND, bowel sounds + Extremities: no edema, no cyanosis    The results of significant diagnostics from this hospitalization (including imaging, microbiology, ancillary and laboratory) are listed below for reference.  Microbiology: No results found for this or any previous visit (from the past 240 hour(s)).   Labs: BNP (last 3 results) No results for input(s): BNP in the last 8760 hours. Basic Metabolic Panel:  Recent Labs Lab 12/01/15 1008 12/03/15 0152 12/03/15 1613 12/04/15 0530 12/05/15 1035  NA 141 136 138 136  --   K 3.8 3.6 3.7 3.7  --   CL 107 102 105 103  --   CO2 27 23 28 27   --   GLUCOSE 120* 144* 101* 93  --   BUN 11 19 18 19   --   CREATININE 1.65* 2.17* 2.26* 2.16* 1.33*  CALCIUM 9.4 9.5 8.6* 8.5*  --    Liver Function Tests:  Recent Labs Lab 12/01/15 1008  AST 23  ALT 17  ALKPHOS 70  BILITOT 1.2  PROT 7.2  ALBUMIN 3.6    Recent Labs Lab 12/01/15 1008  LIPASE 28   No results for input(s): AMMONIA in the last 168 hours. CBC:  Recent Labs Lab 12/01/15 1008 12/03/15 0152 12/04/15 0530  WBC 11.6* 13.8* 8.4  HGB 16.3 15.5 13.1  HCT 49.6 47.4 42.0  MCV 85.2 84.8  87.3  PLT 174 157 109*   Cardiac Enzymes: No results for input(s): CKTOTAL, CKMB, CKMBINDEX, TROPONINI in the last 168 hours. BNP: Invalid input(s): POCBNP CBG: No results for input(s): GLUCAP in the last 168 hours. D-Dimer No results for input(s): DDIMER in the last 72 hours. Hgb A1c No results for input(s): HGBA1C in the last 72 hours. Lipid Profile No results for input(s): CHOL, HDL, LDLCALC, TRIG, CHOLHDL, LDLDIRECT in the last 72 hours. Thyroid function studies No results for input(s): TSH, T4TOTAL, T3FREE, THYROIDAB in the last 72 hours.  Invalid input(s): FREET3 Anemia work up No results for input(s): VITAMINB12, FOLATE, FERRITIN, TIBC, IRON, RETICCTPCT in the last 72 hours. Urinalysis    Component Value Date/Time   COLORURINE YELLOW 12/03/2015 0153   APPEARANCEUR CLEAR 12/03/2015 0153   LABSPEC 1.026 12/03/2015 0153   PHURINE 5.5 12/03/2015 0153   GLUCOSEU NEGATIVE 12/03/2015 0153   HGBUR LARGE* 12/03/2015 0153   BILIRUBINUR NEGATIVE 12/03/2015 0153   KETONESUR 15* 12/03/2015 0153   PROTEINUR NEGATIVE 12/03/2015 0153   NITRITE NEGATIVE 12/03/2015 0153   LEUKOCYTESUR NEGATIVE 12/03/2015 0153   Sepsis Labs Invalid input(s): PROCALCITONIN,  WBC,  LACTICIDVEN Microbiology No results found for this or any previous visit (from the past 240 hour(s)).   Time coordinating discharge: Over 30 minutes  SIGNED:   Hosie Poisson, MD  [facility] 12/08/2015, 9:06 AM Pager [phone]   If 7PM-7AM, please contact night-coverage [email or web address] Password [email or web address]

## 2020-02-27 ENCOUNTER — Telehealth: Payer: Self-pay | Admitting: Gastroenterology

## 2020-02-27 NOTE — Telephone Encounter (Signed)
Hi Dr. Silverio Decamp   We recived a referral from [facility] for a repeat colonoscopy. Patient had a colonoscopy in 2018 obtained op\path reports along with additional records provided for you to review.  Patient is also requesting to discuss an EGD   Please advise on scheduling.  Thank you

## 2020-03-14 NOTE — Telephone Encounter (Signed)
[facility] is checking on status of appt

## 2020-03-14 NOTE — Telephone Encounter (Signed)
[facility] need [facility] from [facility] for additional procedures. Please schedule office visit to discuss next available appt with me or APP . Thanks

## 2020-09-24 DIAGNOSIS — N401 Enlarged prostate with lower urinary tract symptoms: Secondary | ICD-10-CM | POA: Diagnosis not present

## 2020-09-24 DIAGNOSIS — F4310 Post-traumatic stress disorder, unspecified: Secondary | ICD-10-CM | POA: Diagnosis not present

## 2020-09-24 DIAGNOSIS — I10 Essential (primary) hypertension: Secondary | ICD-10-CM | POA: Diagnosis not present

## 2020-09-24 DIAGNOSIS — B1920 Unspecified viral hepatitis C without hepatic coma: Secondary | ICD-10-CM | POA: Diagnosis not present

## 2020-09-24 DIAGNOSIS — D692 Other nonthrombocytopenic purpura: Secondary | ICD-10-CM | POA: Diagnosis not present

## 2020-09-24 DIAGNOSIS — E7800 Pure hypercholesterolemia, unspecified: Secondary | ICD-10-CM | POA: Diagnosis not present

## 2020-09-24 DIAGNOSIS — C61 Malignant neoplasm of prostate: Secondary | ICD-10-CM | POA: Diagnosis not present

## 2020-09-24 DIAGNOSIS — E669 Obesity, unspecified: Secondary | ICD-10-CM | POA: Diagnosis not present

## 2020-10-27 DIAGNOSIS — H401133 Primary open-angle glaucoma, bilateral, severe stage: Secondary | ICD-10-CM | POA: Diagnosis not present

## 2020-11-26 DIAGNOSIS — R200 Anesthesia of skin: Secondary | ICD-10-CM

## 2020-11-26 HISTORY — DX: Anesthesia of skin: R20.0

## 2020-11-27 ENCOUNTER — Emergency Department (HOSPITAL_COMMUNITY)
Admission: EM | Admit: 2020-11-27 | Discharge: 2020-11-28 | Disposition: A | Discharge status: Home / Self Care | Payer: Medicare HMO | Attending: Emergency Medicine | Admitting: Emergency Medicine

## 2020-11-27 ENCOUNTER — Emergency Department (HOSPITAL_COMMUNITY): Payer: Medicare HMO

## 2020-11-27 ENCOUNTER — Other Ambulatory Visit: Payer: Self-pay

## 2020-11-27 DIAGNOSIS — Z79899 Other long term (current) drug therapy: Secondary | ICD-10-CM | POA: Diagnosis not present

## 2020-11-27 DIAGNOSIS — I739 Peripheral vascular disease, unspecified: Secondary | ICD-10-CM | POA: Diagnosis not present

## 2020-11-27 DIAGNOSIS — I10 Essential (primary) hypertension: Secondary | ICD-10-CM | POA: Insufficient documentation

## 2020-11-27 DIAGNOSIS — I6930 Unspecified sequelae of cerebral infarction: Secondary | ICD-10-CM

## 2020-11-27 DIAGNOSIS — R200 Anesthesia of skin: Secondary | ICD-10-CM | POA: Diagnosis not present

## 2020-11-27 DIAGNOSIS — G319 Degenerative disease of nervous system, unspecified: Secondary | ICD-10-CM | POA: Diagnosis not present

## 2020-11-27 DIAGNOSIS — R29818 Other symptoms and signs involving the nervous system: Secondary | ICD-10-CM | POA: Diagnosis not present

## 2020-11-27 DIAGNOSIS — R202 Paresthesia of skin: Secondary | ICD-10-CM | POA: Diagnosis not present

## 2020-11-27 HISTORY — DX: Unspecified sequelae of cerebral infarction: I69.30

## 2020-11-27 LAB — COMPREHENSIVE METABOLIC PANEL
ALT: 18 U/L (ref 0–44)
AST: 21 U/L (ref 15–41)
Albumin: 3.7 g/dL (ref 3.5–5.0)
Alkaline Phosphatase: 57 U/L (ref 38–126)
Anion gap: 8 (ref 5–15)
BUN: 13 mg/dL (ref 8–23)
CO2: 26 mmol/L (ref 22–32)
Calcium: 9.5 mg/dL (ref 8.9–10.3)
Chloride: 108 mmol/L (ref 98–111)
Creatinine, Ser: 1.47 mg/dL — ABNORMAL HIGH (ref 0.61–1.24)
GFR, Estimated: 49 mL/min — ABNORMAL LOW (ref >60)
Glucose, Bld: 94 mg/dL (ref 70–99)
Potassium: 3.9 mmol/L (ref 3.5–5.1)
Sodium: 142 mmol/L (ref 135–145)
Total Bilirubin: 0.7 mg/dL (ref 0.3–1.2)
Total Protein: 7.2 g/dL (ref 6.5–8.1)

## 2020-11-27 LAB — APTT: aPTT: 27 seconds (ref 24–36)

## 2020-11-27 LAB — DIFFERENTIAL
Abs Immature Granulocytes: 0.03 10*3/uL (ref 0.00–0.07)
Basophils Absolute: 0.1 10*3/uL (ref 0.0–0.1)
Basophils Relative: 1 %
Eosinophils Absolute: 0.4 10*3/uL (ref 0.0–0.5)
Eosinophils Relative: 4 %
Immature Granulocytes: 0 %
Lymphocytes Relative: 33 %
Lymphs Abs: 3.4 10*3/uL (ref 0.7–4.0)
Monocytes Absolute: 0.9 10*3/uL (ref 0.1–1.0)
Monocytes Relative: 9 %
Neutro Abs: 5.5 10*3/uL (ref 1.7–7.7)
Neutrophils Relative %: 53 %

## 2020-11-27 LAB — CBC
HCT: 53.4 % — ABNORMAL HIGH (ref 39.0–52.0)
Hemoglobin: 17 g/dL (ref 13.0–17.0)
MCH: 27.8 pg (ref 26.0–34.0)
MCHC: 31.8 g/dL (ref 30.0–36.0)
MCV: 87.3 fL (ref 80.0–100.0)
Platelets: 187 10*3/uL (ref 150–400)
RBC: 6.12 MIL/uL — ABNORMAL HIGH (ref 4.22–5.81)
RDW: 14.2 % (ref 11.5–15.5)
WBC: 10.3 10*3/uL (ref 4.0–10.5)
nRBC: 0 % (ref 0.0–0.2)

## 2020-11-27 LAB — I-STAT CHEM 8, ED
BUN: 15 mg/dL (ref 8–23)
Calcium, Ion: 1.2 mmol/L (ref 1.15–1.40)
Chloride: 109 mmol/L (ref 98–111)
Creatinine, Ser: 1.4 mg/dL — ABNORMAL HIGH (ref 0.61–1.24)
Glucose, Bld: 89 mg/dL (ref 70–99)
HCT: 52 % (ref 39.0–52.0)
Hemoglobin: 17.7 g/dL — ABNORMAL HIGH (ref 13.0–17.0)
Potassium: 3.8 mmol/L (ref 3.5–5.1)
Sodium: 144 mmol/L (ref 135–145)
TCO2: 25 mmol/L (ref 22–32)

## 2020-11-27 LAB — CBG MONITORING, ED: Glucose-Capillary: 94 mg/dL (ref 70–99)

## 2020-11-27 LAB — RAPID URINE DRUG SCREEN, HOSP PERFORMED
Amphetamines: NOT DETECTED
Barbiturates: NOT DETECTED
Benzodiazepines: NOT DETECTED
Cocaine: NOT DETECTED
Opiates: NOT DETECTED
Tetrahydrocannabinol: NOT DETECTED

## 2020-11-27 LAB — ETHANOL: Alcohol, Ethyl (B): <10 mg/dL (ref <10)

## 2020-11-27 LAB — PROTIME-INR
INR: 1 (ref 0.8–1.2)
Prothrombin Time: 13.4 seconds (ref 11.4–15.2)

## 2020-11-27 MED ORDER — LORAZEPAM 2 MG/ML IJ SOLN
1.0000 mg | Freq: Once | INTRAMUSCULAR | Status: AC
  Administered 2020-11-27: 1 mg via INTRAVENOUS
  Filled 2020-11-27: qty 1

## 2020-11-27 NOTE — ED Provider Notes (Signed)
Emergency Medicine Provider Triage Evaluation Note  James Mckinney , a 75 y.o. male  was evaluated in triage.  Pt complains of numbness to entire left side of body including face.  Symptoms began on 11/25/2020.  He initially thought the symptoms would improve but they have not.  States that it feels "heavy" on the left side of his body.  He is able to ambulate but not normally.  No anticoagulant use, head injury or prior stroke.  No chest pain or headache.  Review of Systems  Positive: Left body numbness Negative: Headache, vision changes, chest  Physical Exam  BP (!) 150/87 (BP Location: Right Arm)   Pulse 87   Temp 98.8 F (37.1 C)   Resp 16   SpO2 99%  Gen:   Awake, no distress Resp:  Normal effort MSK:   Moves extremities without difficulty Other:  Very slightly diminished strength on left upper extremity with grip strength.  No facial asymmetry.  Patient able to feel light touch in bilateral upper extremities but states that it feels "different" on the left side including face, arm and leg  Medical Decision Making  Medically screening exam initiated at 4:50 PM.  Appropriate orders placed.  Dionta A Harrison was informed that the remainder of the evaluation will be completed by another provider, this initial triage assessment does not replace that evaluation, and the importance of remaining in the ED until their evaluation is complete.  Will need lab work but no indication for code stroke as his symptoms are greater than 24 hours out   Delia Heady, Hershal Coria 11/27/20 [place], MD 11/28/20 0015

## 2020-11-27 NOTE — ED Provider Notes (Signed)
[facility] EMERGENCY DEPARTMENT Provider Note   CSN: [id number] Arrival date & time: 11/27/20  1635     History Chief Complaint  Patient presents with  . Numbness    James Mckinney is a 75 y.o. male.  75 year old male with history of kidney stone, retention, headaches who presents emerged from today with neurologic symptoms.  Patient states that for the last couple days he has had persistent left sided paresthesias and decreased sensation.  He also feels his left arm is weak.  He states that he has bitten the inside of his lip a couple times without knowing it.  Patient states he is a bedroom.  His wife states that he does seem to ambulate a little bit differently than normal.  He states the most weakness is in his left arm.  He states that no headache.  His blood pressure is high here he states is normally never high.  He is on lisinopril that keeps her pretty well controlled.  Had a stroke or heart attack that he knows of.  No chest pain or back pain.  No abdominal pain.  No associated trauma.        Past Medical History:  Diagnosis Date  . Arthritis   . Glaucoma   . Headache   . Hypertension   . Kidney stone   . Nephrolithiasis 11/2015    Patient Active Problem List   Diagnosis Date Noted  . Nephrolithiasis 12/03/2015  . Hydronephrosis 12/03/2015  . Ureteral stone with hydronephrosis 12/03/2015  . Essential hypertension 12/03/2015  . Glaucoma 12/03/2015  . AKI (acute kidney injury) ([place]) 12/03/2015  . Leukocytosis 12/03/2015  . Constipation 12/03/2015    Past Surgical History:  Procedure Laterality Date  . NO PAST SURGERIES         Family History  Problem Relation Age of Onset  . Cancer Mother        stomach  . Diabetes Father   . Cancer Father        lung - smoker  . Cancer Brother     Social History   Tobacco Use  . Smoking status: Never Smoker  . Smokeless tobacco: Never Used  Substance Use Topics  . Alcohol use: No  . Drug  use: No    Home Medications Prior to Admission medications   Medication Sig Start Date End Date Taking? Authorizing Provider  dorzolamide (TRUSOPT) 2 % ophthalmic solution Place 1 drop into both eyes 2 (two) times daily.  10/15/15   [provider]  latanoprost (XALATAN) 0.005 % ophthalmic solution Place 1 drop into both eyes at bedtime.  10/15/15   [provider]  ondansetron (ZOFRAN) 4 MG tablet Take 1 tablet (4 mg total) by mouth every 6 (six) hours. 12/01/15   Cartner, Marland Kitchen, PA-C  oxyCODONE-acetaminophen (PERCOCET/ROXICET) 5-325 MG tablet Take 1 tablet by mouth every 4 (four) hours as needed for severe pain. 12/05/15   Hosie Poisson, MD  tamsulosin (FLOMAX) 0.4 MG CAPS capsule Take 1 capsule (0.4 mg total) by mouth 2 (two) times daily. 12/01/15   Comer Locket, PA-C    Allergies    Patient has no known allergies.  Review of Systems   Review of Systems  All other systems reviewed and are negative.   Physical Exam Updated Vital Signs BP 140/86 (BP Location: Right Arm)   Pulse 67   Temp 97.8 F (36.6 C) (Oral)   Resp 20   Ht 6\' 1"  (1.854 m)   Wt  116.1 kg   SpO2 98%   BMI 33.78 kg/m   Physical Exam Vitals and nursing note reviewed.  Constitutional:      Appearance: He is well-developed.  HENT:     Head: Normocephalic and atraumatic.     Nose: Nose normal. No congestion or rhinorrhea.     Mouth/Throat:     Mouth: Mucous membranes are moist.     Pharynx: Oropharynx is clear.  Eyes:     Conjunctiva/sclera: Conjunctivae normal.     Pupils: Pupils are equal, round, and reactive to light.  Cardiovascular:     Rate and Rhythm: Normal rate.  Pulmonary:     Effort: Pulmonary effort is normal. No respiratory distress.  Abdominal:     General: Abdomen is flat. There is no distension.  Musculoskeletal:        General: Normal range of motion.     Cervical back: Normal range of motion.  Skin:    General: Skin is warm and dry.     Coloration: Skin is not  pale.  Neurological:     General: No focal deficit present.     Mental Status: He is alert.     ED Results / Procedures / Treatments   Labs (all labs ordered are listed, but only abnormal results are displayed) Labs Reviewed  CBC - Abnormal; Notable for the following components:      Result Value   RBC 6.12 (*)    HCT 53.4 (*)    All other components within normal limits  COMPREHENSIVE METABOLIC PANEL - Abnormal; Notable for the following components:   Creatinine, Ser 1.47 (*)    GFR, Estimated 49 (*)    All other components within normal limits  I-STAT CHEM 8, ED - Abnormal; Notable for the following components:   Creatinine, Ser 1.40 (*)    Hemoglobin 17.7 (*)    All other components within normal limits  ETHANOL  PROTIME-INR  APTT  DIFFERENTIAL  RAPID URINE DRUG SCREEN, HOSP PERFORMED  URINALYSIS, ROUTINE W REFLEX MICROSCOPIC  CBG MONITORING, ED    EKG EKG Interpretation  Date/Time:  Thursday November 27 2020 16:45:29 EDT Ventricular Rate:  88 PR Interval:  146 QRS Duration: 88 QT Interval:  364 QTC Calculation: 440 R Axis:   35 Text Interpretation: Normal sinus rhythm Nonspecific T wave abnormality Abnormal ECG Confirmed by Merrily Pew [phone]) on 11/27/2020 11:01:15 PM   Radiology CT Head Wo Contrast  Result Date: 11/27/2020 CLINICAL DATA:  Numbness EXAM: CT HEAD WITHOUT CONTRAST TECHNIQUE: Contiguous axial images were obtained from the base of the skull through the vertex without intravenous contrast. COMPARISON:  None FINDINGS: Brain: There is atrophy and chronic small vessel disease changes. No acute intracranial abnormality. Specifically, no hemorrhage, hydrocephalus, mass lesion, acute infarction, or significant intracranial injury. Vascular: No hyperdense vessel or unexpected calcification. Skull: No acute calvarial abnormality. Sinuses/Orbits: No acute finding Other: None IMPRESSION: Atrophy, chronic microvascular disease. No acute intracranial abnormality.  Electronically Signed   By: Rolm Baptise M.D.   On: 11/27/2020 21:09   MR ANGIO HEAD WO CONTRAST  Result Date: 11/28/2020 CLINICAL DATA:  Acute neurologic deficit EXAM: MRA HEAD WITHOUT CONTRAST TECHNIQUE: Angiographic images of the Circle of Willis were acquired using MRA technique without intravenous contrast. COMPARISON:  No pertinent prior exam. FINDINGS: POSTERIOR CIRCULATION: --Vertebral arteries: Normal --Inferior cerebellar arteries: Normal. --Basilar artery: Normal. --Superior cerebellar arteries: Normal. --Posterior cerebral arteries: Normal. ANTERIOR CIRCULATION: --Intracranial internal carotid arteries: Normal. --Anterior cerebral arteries (ACA): Normal. --Middle cerebral  arteries (MCA): Normal. ANATOMIC VARIANTS: None IMPRESSION: Normal intracranial MRA. Electronically Signed   By: Ulyses Jarred M.D.   On: 11/28/2020 01:11   MR Angiogram Neck W or Wo Contrast  Result Date: 11/28/2020 CLINICAL DATA:  Acute neurologic deficit EXAM: MRA NECK WITHOUT AND WITH CONTRAST TECHNIQUE: Multiplanar and multiecho pulse sequences of the neck were obtained without and with intravenous contrast. Angiographic images of the neck were obtained using MRA technique without and with intravenous contrast. CONTRAST:  54mL GADAVIST GADOBUTROL 1 MMOL/ML IV SOLN COMPARISON:  None. FINDINGS: Normal codominant vertebral arteries. No stenosis or other abnormality of the carotid systems. Normal aortic arch branching pattern. IMPRESSION: Normal MRA of the neck. Electronically Signed   By: Ulyses Jarred M.D.   On: 11/28/2020 01:10    Procedures Procedures   Medications Ordered in ED Medications  LORazepam (ATIVAN) injection 1 mg (1 mg Intravenous Given 11/27/20 2349)  gadobutrol (GADAVIST) 1 MMOL/ML injection 10 mL (10 mLs Intravenous Contrast Given 11/28/20 0043)    ED Course  I have reviewed the triage vital signs and the nursing notes.  Pertinent labs & imaging results that were available during my care of the  patient were reviewed by me and considered in my medical decision making (see chart for details).    MDM Rules/Calculators/A&P                         MRI without evidence of stroke.  Symptoms do seem pretty consistent with that however without any imaging evidence we will just start aspirin and follow-up with neurology.  Final Clinical Impression(s) / ED Diagnoses Final diagnoses:  None    Rx / DC Orders ED Discharge Orders    None       Klohe Lovering, Corene Cornea, MD 11/28/20 [phone]

## 2020-11-27 NOTE — ED Triage Notes (Signed)
Pt reports left sided numbness and weakness that started Tuesday that has not gotten better or worse. Sensory deficit noted on left side. No drift noted. No droop. Not on blood thinners. Pt denies pain. Hx of htn, compliant with medications.

## 2020-11-28 DIAGNOSIS — R29818 Other symptoms and signs involving the nervous system: Secondary | ICD-10-CM | POA: Diagnosis not present

## 2020-11-28 MED ORDER — GADOBUTROL 1 MMOL/ML IV SOLN
10.0000 mL | Freq: Once | INTRAVENOUS | Status: AC | PRN
  Administered 2020-11-28: 10 mL via INTRAVENOUS

## 2020-11-28 MED ORDER — ASPIRIN 81 MG PO CHEW: 81.0000 mg | CHEWABLE_TABLET | Freq: Every day | ORAL | 3 refills | Status: DC

## 2020-11-28 NOTE — ED Notes (Signed)
Transported to MRI

## 2020-12-04 ENCOUNTER — Ambulatory Visit: Payer: Medicare HMO | Admitting: Neurology

## 2020-12-04 ENCOUNTER — Encounter: Payer: Self-pay | Admitting: Neurology

## 2020-12-04 VITALS — BP 129/81 | HR 88 | Ht 73.0 in | Wt 258.8 lb

## 2020-12-04 DIAGNOSIS — I6339 Cerebral infarction due to thrombosis of other cerebral artery: Secondary | ICD-10-CM | POA: Diagnosis not present

## 2020-12-04 MED ORDER — ALPRAZOLAM 0.5 MG PO TABS: ORAL_TABLET | ORAL | 0 refills | Status: DC

## 2020-12-04 NOTE — Progress Notes (Signed)
Reason for visit: Left hemisensory deficit  Referring physician: [place]  James Mckinney is a 75 y.o. male  History of present illness:  James Mckinney is a 75 year old right-handed black male with a history of hypertension.  The patient went to the emergency room 2 days after onset of symptoms on 27 November 2020.  The patient had noted onset of left sided numbness that affected the face, arm, and leg.  The patient felt that there was some clumsiness or slight weakness of the left arm and leg.  He reported some slight changes in his ability to ambulate, but he did not have any falls.  He had difficulty using the left hand.  He had left facial numbness, but no slurred speech or vision alteration.  He does get injections for fluid in the eye on the left.  The patient may have had a slight headache on the top of the head.  He was regularly taking aspirin 81 mg daily at the time of the onset of deficit.  Over time, the left-sided numbness has improved but not normalized, he still has some residual numbness on the left hand.  In the emergency room, he underwent a CT scan of the brain that showed some chronic small vessel disease but no acute changes, MRA of the head and neck was done and was unremarkable.  He never had MRI of the brain.  He comes to this office for further evaluation.  Past Medical History:  Diagnosis Date   Arthritis    Glaucoma    Headache    Hypertension    Kidney stone    Nephrolithiasis 11/2015    Past Surgical History:  Procedure Laterality Date   NO PAST SURGERIES      Family History  Problem Relation Age of Onset   Cancer Mother        stomach   Diabetes Father    Cancer Father        lung - smoker   Cancer Brother     Social history:  reports that he has never smoked. He has never used smokeless tobacco. He reports that he does not drink alcohol and does not use drugs.  Medications:  Prior to Admission medications   Medication Sig Start Date End Date Taking?  Authorizing Provider  aspirin 81 MG chewable tablet Chew 1 tablet (81 mg total) by mouth daily. 11/28/20  Yes Mesner, Corene Cornea, MD  dorzolamide (TRUSOPT) 2 % ophthalmic solution Place 1 drop into both eyes 2 (two) times daily.  10/15/15  Yes [provider]  latanoprost (XALATAN) 0.005 % ophthalmic solution Place 1 drop into both eyes at bedtime.  10/15/15  Yes [provider]  ondansetron (ZOFRAN) 4 MG tablet Take 1 tablet (4 mg total) by mouth every 6 (six) hours. 12/01/15  Yes Cartner, Marland Kitchen, PA-C  oxyCODONE-acetaminophen (PERCOCET/ROXICET) 5-325 MG tablet Take 1 tablet by mouth every 4 (four) hours as needed for severe pain. 12/05/15  Yes Hosie Poisson, MD  tamsulosin (FLOMAX) 0.4 MG CAPS capsule Take 1 capsule (0.4 mg total) by mouth 2 (two) times daily. 12/01/15  Yes Cartner, Marland Kitchen, PA-C     No Known Allergies  ROS:  Out of a complete 14 system review of symptoms, the patient complains only of the following symptoms, and all other reviewed systems are negative.  Left-sided numbness Mild headache  Blood pressure 129/81, pulse 88, height 6\' 1"  (1.854 m), weight 258 lb 12.8 oz (117.4 kg).  Physical Exam  General: The patient is alert and cooperative at the time of the examination.  The patient is moderately obese.  Eyes: Pupils are anisocoric, 3 mm on the right, 4 to 5 mm on the left, reactive.  Discs are flat on the left, difficult to see on the right due to cataract.  Neck: The neck is supple, no carotid bruits are noted.  Respiratory: The respiratory examination is clear.  Cardiovascular: The cardiovascular examination reveals a regular rate and rhythm, no obvious murmurs or rubs are noted.  Skin: Extremities are without significant edema.  Neurologic Exam  Mental status: The patient is alert and oriented x 3 at the time of the examination. The patient has apparent normal recent and remote memory, with an apparently normal attention span and concentration  ability.  Cranial nerves: Facial symmetry is present. There is good sensation of the face to pinprick and soft touch on the right, but decreased on the left. The strength of the facial muscles and the muscles to head turning and shoulder shrug are normal bilaterally. Speech is well enunciated, no aphasia or dysarthria is noted. Extraocular movements are full. Visual fields are full. The tongue is midline, and the patient has symmetric elevation of the soft palate. No obvious hearing deficits are noted.  Motor: The motor testing reveals 5 over 5 strength of all 4 extremities. Good symmetric motor tone is noted throughout.  Sensory: Sensory testing is notable for symmetric pinprick sensation on the arms and legs, but vibration sensation is decreased on the left foot as compared to the right, symmetric in the arms.  Position sensation is intact on all 4 extremities.  No evidence of extinction is noted.  Coordination: Cerebellar testing reveals good finger-nose-finger and heel-to-shin bilaterally.  Gait and station: Gait is normal. Tandem gait is unsteady.  Romberg is negative, but is slightly unsteady. No drift is seen.  Reflexes: Deep tendon reflexes are symmetric and normal bilaterally. Toes are downgoing bilaterally.   CT head 11/27/20:  IMPRESSION: Atrophy, chronic microvascular disease.   No acute intracranial abnormality.  * CT scan images were reviewed online. I agree with the written report.   MRA head 11/28/20:  IMPRESSION: Normal intracranial MRA.   MRA neck 11/28/20:  IMPRESSION: Normal MRA of the neck.    Assessment/Plan:  1.  Left hemisensory deficit, probable right thalamic lacunar infarct  2.  History of hypertension  The patient will go up on his aspirin dose to 325 mg daily.  I will send him back for MRI of the brain, if a stroke is confirmed, we will check 2D echocardiogram in the future.  I will contact him with results.  Jill Alexanders MD 12/04/2020 8:49  AM  [facility] [address] [place], [place] [address]  Phone [phone] Fax [phone]

## 2020-12-05 DIAGNOSIS — R202 Paresthesia of skin: Secondary | ICD-10-CM | POA: Diagnosis not present

## 2020-12-05 DIAGNOSIS — I10 Essential (primary) hypertension: Secondary | ICD-10-CM | POA: Diagnosis not present

## 2020-12-09 ENCOUNTER — Telehealth: Payer: Self-pay | Admitting: Neurology

## 2020-12-09 NOTE — Telephone Encounter (Signed)
Pt's wife, Aki Abalos (on [place]) called, was told to call the physician for medication for his MRI because he is claustrophobic.  Can send to [facility] [phone] Would like a call from the nurse.

## 2020-12-09 NOTE — Telephone Encounter (Signed)
Returned call to patient.  He is scheduled for a MRI Saturday morning.  He is claustrophobic and would like something to help relax him.  Message sent to Dr. Jannifer Franklin for review

## 2020-12-10 ENCOUNTER — Ambulatory Visit: Payer: Self-pay | Admitting: Neurology

## 2020-12-10 NOTE — Telephone Encounter (Signed)
Pt's wife called and was informed of provider's message. Wife was very [employer].

## 2020-12-13 ENCOUNTER — Ambulatory Visit
Admission: RE | Admit: 2020-12-13 | Discharge: 2020-12-13 | Disposition: A | Discharge status: Home / Self Care | Payer: Medicare HMO | Source: Ambulatory Visit | Attending: Neurology | Admitting: Neurology

## 2020-12-13 DIAGNOSIS — I6339 Cerebral infarction due to thrombosis of other cerebral artery: Secondary | ICD-10-CM | POA: Diagnosis not present

## 2020-12-14 ENCOUNTER — Telehealth: Payer: Self-pay | Admitting: Neurology

## 2020-12-14 DIAGNOSIS — I6322 Cerebral infarction due to unspecified occlusion or stenosis of basilar arteries: Secondary | ICD-10-CM

## 2020-12-14 NOTE — Telephone Encounter (Signed)
I called the patient.  MRI of the brain shows a subacute infarct of the right pontine area, I will go ahead and do MRA of the head and neck, 2D echocardiogram, he will remain on aspirin.  His blood pressure was well controlled when seen in office.   MRI brain 12/13/20:  IMPRESSION: Abnormal MRI scan of the brain without contrast showing subacute right pontine lacunar infarct and age-appropriate changes of chronic small vessel disease as well as generalized cerebral atrophy with slight asymmetric dilatation of the left lateral ventricle.

## 2020-12-15 ENCOUNTER — Telehealth: Payer: Self-pay | Admitting: Neurology

## 2020-12-15 NOTE — Telephone Encounter (Signed)
MRA of neck & head approved by [facility]. PA #[id number] (12/15/20- 01/14/21). Sent to [place] for scheduling. Phone: [phone].

## 2020-12-27 ENCOUNTER — Ambulatory Visit
Admission: RE | Admit: 2020-12-27 | Discharge: 2020-12-27 | Disposition: A | Discharge status: Home / Self Care | Payer: Medicare HMO | Source: Ambulatory Visit | Attending: Neurology | Admitting: Neurology

## 2020-12-27 ENCOUNTER — Other Ambulatory Visit: Payer: Self-pay

## 2020-12-27 DIAGNOSIS — I6322 Cerebral infarction due to unspecified occlusion or stenosis of basilar arteries: Secondary | ICD-10-CM

## 2020-12-27 MED ORDER — GADOBENATE DIMEGLUMINE 529 MG/ML IV SOLN
20.0000 mL | Freq: Once | INTRAVENOUS | Status: AC | PRN
  Administered 2020-12-27: 20 mL via INTRAVENOUS

## 2020-12-29 ENCOUNTER — Telehealth: Payer: Self-pay | Admitting: Neurology

## 2020-12-29 NOTE — Telephone Encounter (Signed)
I called the patient.  MRA of the head and neck was unremarkable.  2D echocardiogram is pending.  The patient likely has had a small vessel infarct.  He is to remain on aspirin.   MRA neck 12/28/20:  IMPRESSION: Unremarkable MR angiogram of the neck with and without contrast showing no major blockages of either carotid arteries in the neck.  Both vertebral arteries show antegrade flow.   MRA head 12/28/20:  IMPRESSION: Unremarkable MR angiogram study of the brain showing no significant stenosis of the large and medium size vessels.

## 2020-12-30 DIAGNOSIS — C61 Malignant neoplasm of prostate: Secondary | ICD-10-CM | POA: Diagnosis not present

## 2020-12-30 DIAGNOSIS — B1920 Unspecified viral hepatitis C without hepatic coma: Secondary | ICD-10-CM | POA: Diagnosis not present

## 2020-12-30 DIAGNOSIS — N401 Enlarged prostate with lower urinary tract symptoms: Secondary | ICD-10-CM | POA: Diagnosis not present

## 2020-12-30 DIAGNOSIS — F4310 Post-traumatic stress disorder, unspecified: Secondary | ICD-10-CM | POA: Diagnosis not present

## 2020-12-30 DIAGNOSIS — E669 Obesity, unspecified: Secondary | ICD-10-CM | POA: Diagnosis not present

## 2020-12-30 DIAGNOSIS — D692 Other nonthrombocytopenic purpura: Secondary | ICD-10-CM | POA: Diagnosis not present

## 2020-12-30 DIAGNOSIS — E7800 Pure hypercholesterolemia, unspecified: Secondary | ICD-10-CM | POA: Diagnosis not present

## 2020-12-30 DIAGNOSIS — R202 Paresthesia of skin: Secondary | ICD-10-CM | POA: Diagnosis not present

## 2020-12-30 DIAGNOSIS — I10 Essential (primary) hypertension: Secondary | ICD-10-CM | POA: Diagnosis not present

## 2021-01-05 ENCOUNTER — Ambulatory Visit (HOSPITAL_COMMUNITY)
Admission: RE | Admit: 2021-01-05 | Discharge: 2021-01-05 | Disposition: A | Discharge status: Home / Self Care | Payer: Medicare HMO | Source: Ambulatory Visit | Attending: Neurology | Admitting: Neurology

## 2021-01-05 ENCOUNTER — Telehealth: Payer: Self-pay | Admitting: Neurology

## 2021-01-05 ENCOUNTER — Other Ambulatory Visit: Payer: Self-pay

## 2021-01-05 DIAGNOSIS — I6389 Other cerebral infarction: Secondary | ICD-10-CM

## 2021-01-05 DIAGNOSIS — I6322 Cerebral infarction due to unspecified occlusion or stenosis of basilar arteries: Secondary | ICD-10-CM

## 2021-01-05 DIAGNOSIS — I110 Hypertensive heart disease with heart failure: Secondary | ICD-10-CM | POA: Insufficient documentation

## 2021-01-05 LAB — ECHOCARDIOGRAM COMPLETE
Area-P 1/2: 2.99 cm2
S' Lateral: 2.6 cm

## 2021-01-05 NOTE — Telephone Encounter (Signed)
I called the patient.  2D echocardiogram of the heart was unremarkable, no source of stroke seen.  The patient likely sustained a small vessel infarct.   2D echo 01/05/21:  IMPRESSIONS      1. Left ventricular ejection fraction, by estimation, is 55 to 60%. The left ventricle has normal function. The left ventricle has no regional wall motion abnormalities. Left ventricular diastolic parameters are consistent with Grade I diastolic dysfunction (impaired relaxation).  2. Right ventricular systolic function is normal. The right ventricular size is normal. Tricuspid regurgitation signal is inadequate for assessing PA pressure.  3. The mitral valve is normal in structure. No evidence of mitral valve regurgitation. No evidence of mitral stenosis.  4. The aortic valve is tricuspid. There is mild thickening of the aortic valve. Aortic valve regurgitation is not visualized. No aortic stenosis is present.  5. The inferior vena cava is normal in size with greater than 50% respiratory variability, suggesting right atrial pressure of 3 mmHg.   Comparison(s): No prior Echocardiogram.   Conclusion(s)/Recommendation(s): Otherwise normal echocardiogram, with minor abnormalities described in the report.

## 2021-02-16 ENCOUNTER — Telehealth: Payer: Self-pay | Admitting: Neurology

## 2021-02-16 NOTE — Telephone Encounter (Signed)
I called the patient.  The patient recently had a right pontine stroke event, MRA of the head and neck did not show any evidence of large vessel changes.  The patient had an episode today of transient dizziness and some slight numbness of the right hand.  This has improved some, no clumsiness or gait change or slurred speech or double vision or loss of vision has been noted.  I would watch conservatively at this point, the patient will continue taking his aspirin, his blood pressures have been under good control.  If he has an event with focal weakness or clumsiness or slurred speech, he is to go to the emergency room.

## 2021-02-16 NOTE — Telephone Encounter (Signed)
Pt is wanting to speak to the RN to discuss the dizziness and tingling sensation that he felt this morning while he was driving.

## 2021-02-16 NOTE — Telephone Encounter (Signed)
Pt called to report in on dizziness and tingling felt in his feet this morning while driving. This is a newer issue. Wanted MD to be aware and see if he had any input on this?

## 2021-03-19 DIAGNOSIS — H401133 Primary open-angle glaucoma, bilateral, severe stage: Secondary | ICD-10-CM | POA: Diagnosis not present

## 2021-06-01 DIAGNOSIS — H401133 Primary open-angle glaucoma, bilateral, severe stage: Secondary | ICD-10-CM | POA: Diagnosis not present

## 2021-07-02 DIAGNOSIS — B1920 Unspecified viral hepatitis C without hepatic coma: Secondary | ICD-10-CM | POA: Diagnosis not present

## 2021-07-02 DIAGNOSIS — I10 Essential (primary) hypertension: Secondary | ICD-10-CM | POA: Diagnosis not present

## 2021-07-02 DIAGNOSIS — F1310 Sedative, hypnotic or anxiolytic abuse, uncomplicated: Secondary | ICD-10-CM | POA: Diagnosis not present

## 2021-07-02 DIAGNOSIS — C61 Malignant neoplasm of prostate: Secondary | ICD-10-CM | POA: Diagnosis not present

## 2021-07-02 DIAGNOSIS — E7800 Pure hypercholesterolemia, unspecified: Secondary | ICD-10-CM | POA: Diagnosis not present

## 2021-07-02 DIAGNOSIS — E669 Obesity, unspecified: Secondary | ICD-10-CM | POA: Diagnosis not present

## 2021-07-02 DIAGNOSIS — Z87442 Personal history of urinary calculi: Secondary | ICD-10-CM | POA: Diagnosis not present

## 2021-07-02 DIAGNOSIS — R202 Paresthesia of skin: Secondary | ICD-10-CM | POA: Diagnosis not present

## 2021-07-02 DIAGNOSIS — N401 Enlarged prostate with lower urinary tract symptoms: Secondary | ICD-10-CM | POA: Diagnosis not present

## 2021-07-02 DIAGNOSIS — D692 Other nonthrombocytopenic purpura: Secondary | ICD-10-CM | POA: Diagnosis not present

## 2021-09-14 DIAGNOSIS — H35371 Puckering of macula, right eye: Secondary | ICD-10-CM | POA: Diagnosis not present

## 2021-09-14 DIAGNOSIS — H34812 Central retinal vein occlusion, left eye, with macular edema: Secondary | ICD-10-CM | POA: Diagnosis not present

## 2021-09-14 DIAGNOSIS — H31093 Other chorioretinal scars, bilateral: Secondary | ICD-10-CM | POA: Diagnosis not present

## 2021-09-14 DIAGNOSIS — H2511 Age-related nuclear cataract, right eye: Secondary | ICD-10-CM | POA: Diagnosis not present

## 2021-11-03 DIAGNOSIS — C61 Malignant neoplasm of prostate: Secondary | ICD-10-CM | POA: Diagnosis not present

## 2021-12-01 DIAGNOSIS — C61 Malignant neoplasm of prostate: Secondary | ICD-10-CM | POA: Diagnosis not present

## 2021-12-16 ENCOUNTER — Other Ambulatory Visit: Payer: Self-pay | Admitting: Urology

## 2021-12-16 DIAGNOSIS — C61 Malignant neoplasm of prostate: Secondary | ICD-10-CM

## 2021-12-25 DIAGNOSIS — I10 Essential (primary) hypertension: Secondary | ICD-10-CM | POA: Diagnosis not present

## 2021-12-25 DIAGNOSIS — F4310 Post-traumatic stress disorder, unspecified: Secondary | ICD-10-CM | POA: Diagnosis not present

## 2021-12-30 DIAGNOSIS — E7800 Pure hypercholesterolemia, unspecified: Secondary | ICD-10-CM | POA: Diagnosis not present

## 2021-12-30 DIAGNOSIS — Z0000 Encounter for general adult medical examination without abnormal findings: Secondary | ICD-10-CM | POA: Diagnosis not present

## 2021-12-30 DIAGNOSIS — I10 Essential (primary) hypertension: Secondary | ICD-10-CM | POA: Diagnosis not present

## 2021-12-30 DIAGNOSIS — Z125 Encounter for screening for malignant neoplasm of prostate: Secondary | ICD-10-CM | POA: Diagnosis not present

## 2022-01-04 ENCOUNTER — Ambulatory Visit
Admission: RE | Admit: 2022-01-04 | Discharge: 2022-01-04 | Disposition: A | Discharge status: Home / Self Care | Payer: Medicare HMO | Source: Ambulatory Visit | Attending: Urology | Admitting: Urology

## 2022-01-04 DIAGNOSIS — H43811 Vitreous degeneration, right eye: Secondary | ICD-10-CM | POA: Diagnosis not present

## 2022-01-04 DIAGNOSIS — R972 Elevated prostate specific antigen [PSA]: Secondary | ICD-10-CM | POA: Diagnosis not present

## 2022-01-04 DIAGNOSIS — H34812 Central retinal vein occlusion, left eye, with macular edema: Secondary | ICD-10-CM | POA: Diagnosis not present

## 2022-01-04 DIAGNOSIS — N210 Calculus in bladder: Secondary | ICD-10-CM | POA: Diagnosis not present

## 2022-01-04 DIAGNOSIS — H35373 Puckering of macula, bilateral: Secondary | ICD-10-CM | POA: Diagnosis not present

## 2022-01-04 DIAGNOSIS — C61 Malignant neoplasm of prostate: Secondary | ICD-10-CM

## 2022-01-04 MED ORDER — GADOBENATE DIMEGLUMINE 529 MG/ML IV SOLN
20.0000 mL | Freq: Once | INTRAVENOUS | Status: AC | PRN
  Administered 2022-01-04: 20 mL via INTRAVENOUS

## 2022-01-06 DIAGNOSIS — Z1389 Encounter for screening for other disorder: Secondary | ICD-10-CM | POA: Diagnosis not present

## 2022-01-06 DIAGNOSIS — R202 Paresthesia of skin: Secondary | ICD-10-CM | POA: Diagnosis not present

## 2022-01-06 DIAGNOSIS — M19042 Primary osteoarthritis, left hand: Secondary | ICD-10-CM | POA: Diagnosis not present

## 2022-01-06 DIAGNOSIS — C61 Malignant neoplasm of prostate: Secondary | ICD-10-CM | POA: Diagnosis not present

## 2022-01-06 DIAGNOSIS — Z0000 Encounter for general adult medical examination without abnormal findings: Secondary | ICD-10-CM | POA: Diagnosis not present

## 2022-01-06 DIAGNOSIS — E7800 Pure hypercholesterolemia, unspecified: Secondary | ICD-10-CM | POA: Diagnosis not present

## 2022-01-06 DIAGNOSIS — Z1331 Encounter for screening for depression: Secondary | ICD-10-CM | POA: Diagnosis not present

## 2022-01-06 DIAGNOSIS — I10 Essential (primary) hypertension: Secondary | ICD-10-CM | POA: Diagnosis not present

## 2022-01-06 DIAGNOSIS — R82998 Other abnormal findings in urine: Secondary | ICD-10-CM | POA: Diagnosis not present

## 2022-01-06 DIAGNOSIS — N401 Enlarged prostate with lower urinary tract symptoms: Secondary | ICD-10-CM | POA: Diagnosis not present

## 2022-01-06 DIAGNOSIS — F4310 Post-traumatic stress disorder, unspecified: Secondary | ICD-10-CM | POA: Diagnosis not present

## 2022-01-06 DIAGNOSIS — E669 Obesity, unspecified: Secondary | ICD-10-CM | POA: Diagnosis not present

## 2022-01-06 DIAGNOSIS — D692 Other nonthrombocytopenic purpura: Secondary | ICD-10-CM | POA: Diagnosis not present

## 2022-01-21 DIAGNOSIS — C61 Malignant neoplasm of prostate: Secondary | ICD-10-CM | POA: Diagnosis not present

## 2022-01-25 DIAGNOSIS — N3289 Other specified disorders of bladder: Secondary | ICD-10-CM | POA: Diagnosis not present

## 2022-01-25 DIAGNOSIS — C61 Malignant neoplasm of prostate: Secondary | ICD-10-CM | POA: Diagnosis not present

## 2022-02-02 DIAGNOSIS — C61 Malignant neoplasm of prostate: Secondary | ICD-10-CM | POA: Diagnosis not present

## 2022-02-02 DIAGNOSIS — D075 Carcinoma in situ of prostate: Secondary | ICD-10-CM | POA: Diagnosis not present

## 2022-02-08 ENCOUNTER — Other Ambulatory Visit (HOSPITAL_COMMUNITY): Payer: Self-pay | Admitting: Urology

## 2022-02-08 DIAGNOSIS — C61 Malignant neoplasm of prostate: Secondary | ICD-10-CM

## 2022-02-09 NOTE — Progress Notes (Signed)
I called pt to introduce myself as the Prostate Nurse Navigator and the Coordinator of the [facility] [place].   1. I confirmed with the patient he is aware of his referral to the clinic 8/22, arriving @ 12:30 pm.    2. I discussed the format of the clinic and the physicians he will be seeing that day.   3. I discussed where the clinic is located and how to contact me.   4. I confirmed his address and informed him I would be mailing a packet of information and forms to be completed. I asked him to bring them with him the day of his appointment.    He voiced understanding of the above. I asked him to call me if he has any questions or concerns regarding his appointments or the forms he needs to complete.

## 2022-02-12 NOTE — Progress Notes (Signed)
Pt has been rescheduled for 8/23 for his PSMA PET scan.  Patient notified, and given contact information for any potential conflicts.

## 2022-02-15 NOTE — Progress Notes (Signed)
Spoke with patient and confirmed his upcoming [facility] appointment on 8/22 with arrival time of 12:30pm.   Pt verbalized he did receive packet and will have this completed for upcoming appointments.   No further needs at this time.

## 2022-02-15 NOTE — Progress Notes (Signed)
Radiation Oncology         [phone]) [phone] ________________________________  Multidisciplinary Prostate Cancer Clinic  Initial Radiation Oncology Consultation  Name: James Mckinney MRN: [id number]  Date: 02/16/2022  DOB: [birth year]  WE:[id number], Fransico Him, MD  James Bring, MD   REFERRING PHYSICIAN: Raynelle Bring, MD  DIAGNOSIS: 76 y.o. gentleman with stage T2a adenocarcinoma of the prostate with a Gleason's score of 4+4 and a PSA of 18.02    ICD-10-CM   1. Malignant neoplasm of prostate ([place])  C61       HISTORY OF PRESENT ILLNESS::James Mckinney is a 76 y.o. gentleman with a history of kidney stones, previously treated by Dr. Gaynelle Mckinney in 2017. He was initially diagnosed with Gleason 3+3 prostate cancer in 2018 (only 2% in 1/12 cores on bx 04/19/17 with Dr. Greer Mckinney at the [facility]) with a PSA of 11 at the time of diagnosis. He elected to proceed with active surveillance, but he declined repeat biopsy amd imaging so he really has only had PSA monitoring alone, despite the continued rise in his PSA, up to 16.9 in 08/2020.  He was referred by his PCP, Dr. Osborne Casco, for evaluation in urology by Dr. Alinda Money on 12/01/21,  digital rectal examination was performed at that time revealing a benign lobular area at the left mid gland, but no discrete nodularity. A repeat PSA obtained that day was overall stable at 15.8. He did agree to proceed with prostate MRI which was performed on 01/04/22 showing a total of four peripheral zone lesions-- two PI-RADS 5 on the left, one PI-RADS 4 on the left, and one PI-RADS 3 on the right. Also noted was a potentially enhancing segment of appendix. Further evaluation with CT A/P on 01/25/22 showed severe prostatomegaly but no evidence of lymphadenopathy, soft tissue metastatic disease, or osseous metastatic disease. 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  The patient proceeded to MRI fusion biopsy of the  prostate on 02/02/22.  The prostate volume measured 206.9 cc.  Out of 28 core biopsies, 6 were positive.  The maximum Gleason score was 4+4, and this was seen in two cores from ROI lesion #2 (on the left). Additionally, Gleason 4+3 was seen in the left apex and one core from ROI lesion #4 (on the right- small focus), Gleason 3+4 in one core from ROI lesion #1 (on the left- small focus), and Gleason 3+3 in one core from ROI #4 (on the right- small focus).  He is scheduled for PSMA PET scan on 02/17/22 to complete his disease staging.  He is also scheduled to see Dr. Kaylyn Mckinney, general surgeon in [place] on 02/18/2022 to discuss any further evaluation and/or management of the new appendiceal mucocele.  The patient reviewed the biopsy results with his urologist and he has kindly been referred today to the multidisciplinary prostate cancer clinic for presentation of pathology and radiology studies in our conference for discussion of potential radiation treatment options and clinical evaluation.  PREVIOUS RADIATION THERAPY: No  PAST MEDICAL HISTORY:  has a past medical history of Arthritis, Glaucoma, Headache, Hypertension, Kidney stone, and Nephrolithiasis (11/2015).    PAST SURGICAL HISTORY: Past Surgical History:  Procedure Laterality Date   NO PAST SURGERIES      FAMILY HISTORY: family history includes Cancer in his brother, father, and mother; Diabetes in his father.  SOCIAL HISTORY:  reports that he has never smoked. He has never used smokeless tobacco. He reports that he does  not drink alcohol and does not use drugs.  ALLERGIES: Patient has no known allergies.  MEDICATIONS:  Current Outpatient Medications  Medication Sig Dispense Refill   Acetaminophen (TYLENOL EXTRA STRENGTH PO) Take by mouth as needed.     aspirin 325 MG tablet Take 325 mg by mouth daily.     brimonidine (ALPHAGAN) 0.2 % ophthalmic solution Place 1 drop into both eyes 2 (two) times daily.     dorzolamide (TRUSOPT) 2 %  ophthalmic solution Place 1 drop into both eyes 2 (two) times daily.   0   ibuprofen (ADVIL) 200 MG tablet Take 200 mg by mouth every 6 (six) hours as needed.     latanoprost (XALATAN) 0.005 % ophthalmic solution Place 1 drop into both eyes at bedtime.   0   lisinopril (ZESTRIL) 20 MG tablet Take 20 mg by mouth daily.     rosuvastatin (CRESTOR) 10 MG tablet Take 10 mg by mouth daily.     tamsulosin (FLOMAX) 0.4 MG CAPS capsule Take 1 capsule (0.4 mg total) by mouth 2 (two) times daily. 10 capsule 0   VITAMIN D PO Take 50 mg by mouth daily.     ALPRAZolam (XANAX) 0.5 MG tablet Take 2 tablets approximately 45 minutes prior to the MRI study, take a third tablet if needed. (Patient not taking: Reported on 02/16/2022) 3 tablet 0   ondansetron (ZOFRAN) 4 MG tablet Take 1 tablet (4 mg total) by mouth every 6 (six) hours. (Patient not taking: Reported on 02/16/2022) 12 tablet 0   oxyCODONE-acetaminophen (PERCOCET/ROXICET) 5-325 MG tablet Take 1 tablet by mouth every 4 (four) hours as needed for severe pain. (Patient not taking: Reported on 02/16/2022) 10 tablet 0   No current facility-administered medications for this encounter.    REVIEW OF SYSTEMS:  On review of systems, the patient reports that he is doing well overall. He denies any chest pain, shortness of breath, cough, fevers, chills, night sweats, unintended weight changes. He denies any bowel disturbances, and denies abdominal pain, nausea or vomiting. He denies any new musculoskeletal or joint aches or pains. His IPSS was 18, indicating moderate urinary symptoms. His SHIM was 5, indicating he has severe erectile dysfunction. A complete review of systems is obtained and is otherwise negative.   PHYSICAL EXAM:  Wt Readings from Last 3 Encounters:  02/16/22 252 lb 9.6 oz (114.6 kg)  12/04/20 258 lb 12.8 oz (117.4 kg)  11/27/20 256 lb (116.1 kg)   Temp Readings from Last 3 Encounters:  02/16/22 97.8 F (36.6 C) (Temporal)  11/28/20 97.7 F (36.5  C) (Oral)  12/05/15 98.6 F (37 C) (Oral)   BP Readings from Last 3 Encounters:  02/16/22 119/75  12/04/20 129/81  11/28/20 138/80   Pulse Readings from Last 3 Encounters:  02/16/22 69  12/04/20 88  11/28/20 71    /10  In general this is a well appearing African-American male in no acute distress.  He's alert and oriented x4 and appropriate throughout the examination. Cardiopulmonary assessment is negative for acute distress and he exhibits normal effort.    KPS = 100  100 - Normal; no complaints; no evidence of disease. 90   - Able to carry on normal activity; minor signs or symptoms of disease. 80   - Normal activity with effort; some signs or symptoms of disease. 33   - Cares for self; unable to carry on normal activity or to do active work. 60   - Requires occasional assistance, but is able  to care for most of his personal needs. 50   - Requires considerable assistance and frequent medical care. 6   - Disabled; requires special care and assistance. 70   - Severely disabled; hospital admission is indicated although death not imminent. 71   - Very sick; hospital admission necessary; active supportive treatment necessary. 10   - Moribund; fatal processes progressing rapidly. 0     - Dead  Karnofsky DA, Abelmann [place], Craver LS and Burchenal JH [phone]) The use of the nitrogen mustards in the palliative treatment of carcinoma: with particular reference to bronchogenic carcinoma Cancer 1 634-56   LABORATORY DATA:  Lab Results  Component Value Date   WBC 10.3 11/27/2020   HGB 17.7 (H) 11/27/2020   HCT 52.0 11/27/2020   MCV 87.3 11/27/2020   PLT 187 11/27/2020   Lab Results  Component Value Date   NA 144 11/27/2020   K 3.8 11/27/2020   CL 109 11/27/2020   CO2 26 11/27/2020   Lab Results  Component Value Date   ALT 18 11/27/2020   AST 21 11/27/2020   ALKPHOS 57 11/27/2020   BILITOT 0.7 11/27/2020     RADIOGRAPHY: NM PET (PSMA) SKULL TO MID THIGH  Result Date:  02/19/2022 CLINICAL DATA:  Prostate carcinoma with biochemical recurrence. EXAM: NUCLEAR MEDICINE PET SKULL BASE TO THIGH TECHNIQUE: mCi F18 Piflufolastat (Pylarify) was injected intravenously. Full-ring PET imaging was performed from the skull base to thigh after the radiotracer. CT data was obtained and used for attenuation correction and anatomic localization. COMPARISON:  None Available. FINDINGS: NECK No radiotracer activity in neck lymph nodes. Incidental CT finding: None. CHEST No radiotracer accumulation within mediastinal or hilar lymph nodes. No suspicious pulmonary nodules on the CT scan. Incidental CT finding: None. ABDOMEN/PELVIS Prostate: Foci of intense radiotracer activity in the LEFT lateral mid gland with SUV max equal 43.6. Lesion measures approximately 1 cm (image 214). Smaller more inferior LEFT peripheral zone lesion with SUV max equal 16.8 image 218. Lymph nodes: No abnormal radiotracer accumulation within pelvic or abdominal nodes. Liver: No evidence of liver metastasis. Incidental CT finding: None. SKELETON No focal activity to suggest skeletal metastasis. IMPRESSION: 1. Focal prostate adenocarcinoma in the LEFT peripheral zone. 2. No evidence metastatic adenopathy in the pelvis or periaortic retroperitoneum. 3. No evidence of visceral metastasis or skeletal metastasis. Electronically Signed   By: Suzy Bouchard M.D.   On: 02/19/2022 09:30      IMPRESSION/PLAN: 76 y.o. gentleman with Stage T2a adenocarcinoma of the prostate with a Gleason score of 4+4 and a PSA of 18.02.    We discussed the patient's workup and outlined the nature of prostate cancer in this setting. The patient's T stage, Gleason's score, and PSA put him into the high risk group.  He is scheduled for a PSMA PET scan on 02/17/2022 to complete his disease staging and pending this does not show any unexpected, distant metastases, he is eligible for a variety of potential treatment options including prostatectomy or LT-ADT  in combination with either 8 weeks of external radiation, or 5 weeks of external beam radiation with an upfront seed boost. We discussed the available radiation techniques, and focused on the details and logistics of delivery. The patient is not a candidate for brachytherapy boost with a prostate volume of 206 cc.  Therefore, we discussed and outlined the risks, benefits, short and long-term effects associated with daily external beam radiotherapy and compared and contrasted these with prostatectomy. We discussed the role of SpaceOAR  gel in reducing the rectal toxicity associated with radiotherapy. We also detailed the role of ADT in the treatment of high risk prostate cancer and outlined the associated side effects that could be expected with this therapy. He appears to have a good understanding of his disease and our treatment recommendations which are of curative intent.  He was encouraged to ask questions that were answered to his stated satisfaction.  He is scheduled for a PSMA PET on 02/17/22 and pending there are no findings to suggest widespread metastatic disease, he is on board with LT-ADT concurrent with 8 weeks of EBRT to the prostate and pelvic lymph nodes. There was an incidental finding of appendiccal mucocele on his prostate MRI so he is scheduled to meet with a general surgeon, Dr. Kaylyn Mckinney, in [place] on 02/18/22 to discuss management. If they feel that he needs surgery to remove the appendix, this would take precedent over the start of prostate radiation but he would be protected on the ADT in the interim. We will coordinate for a follow up visit at [facility], first available, to start ADT now and plan for placement of fiducial markers and SpaceOAR gel in late October, in anticipation of beginning his radiation treatments 2 months after starting ADT unless indicated otherwise pending his upcoming surgical consult.  We enjoyed meeting with him today and look forward to continue to  participate in his care.  He knows that he is welcome to call at anytime with any questions or concerns related to radiation treatments.  We personally spent 60 minutes in this encounter including chart review, reviewing radiological studies, meeting face-to-face with the patient, entering orders and completing documentation.    Nicholos Johns, PA-C    Tyler Pita, MD  [facility] Direct Dial: [phone]  Fax: [phone] [email or web address]  Skype  [email or web address]   This document serves as a record of services personally performed by Tyler Pita, MD and Freeman Caldron, PA-C. It was created on their behalf by Wilburn Mylar, a trained medical scribe. The creation of this record is based on the scribe's personal observations and the provider's statements to them. This document has been checked and approved by the attending provider.

## 2022-02-16 ENCOUNTER — Other Ambulatory Visit: Payer: Self-pay | Admitting: Genetic Counselor

## 2022-02-16 ENCOUNTER — Inpatient Hospital Stay (HOSPITAL_BASED_OUTPATIENT_CLINIC_OR_DEPARTMENT_OTHER): Payer: Medicare HMO | Admitting: Genetic Counselor

## 2022-02-16 ENCOUNTER — Inpatient Hospital Stay: Payer: Medicare HMO | Attending: Radiation Oncology

## 2022-02-16 ENCOUNTER — Ambulatory Visit
Admission: RE | Admit: 2022-02-16 | Discharge: 2022-02-16 | Disposition: A | Discharge status: Home / Self Care | Payer: Medicare HMO | Source: Ambulatory Visit | Attending: Radiation Oncology | Admitting: Radiation Oncology

## 2022-02-16 ENCOUNTER — Ambulatory Visit (HOSPITAL_COMMUNITY): Payer: Medicare HMO

## 2022-02-16 DIAGNOSIS — Z803 Family history of malignant neoplasm of breast: Secondary | ICD-10-CM

## 2022-02-16 DIAGNOSIS — C61 Malignant neoplasm of prostate: Secondary | ICD-10-CM

## 2022-02-16 DIAGNOSIS — Z8042 Family history of malignant neoplasm of prostate: Secondary | ICD-10-CM

## 2022-02-16 DIAGNOSIS — Z191 Hormone sensitive malignancy status: Secondary | ICD-10-CM | POA: Diagnosis not present

## 2022-02-16 LAB — GENETIC SCREENING ORDER

## 2022-02-16 NOTE — Consult Note (Signed)
Multi-Disciplinary Clinic     02/16/2022   --------------------------------------------------------------------------------   James Mckinney. Shipes  MRN: [id number]  DOB: 06-Nov-1945, 76 year old Male  SSN: [id number]   PRIMARY CARE:  Haywood Pao, MD  REFERRING:  Raynelle Bring, Eduardo Osier  PROVIDER:  Raynelle Bring, M.D.  LOCATION:  [facility] [phone]     --------------------------------------------------------------------------------   CC/HPI: CC: Prostate Cancer   PCP: Dr. Domenick Gong  Location of consult: [facility] - Prostate Cancer Multidisciplinary Clinic   James Mckinney is a 76 year old gentleman who has a PMH significant for TIA (2002), hypertension, glaucoma, PTSD, hepatitis C, GERD, anxiety, depression, and sleep apnea. He was initially diagnosed with prostate cancer at the [facility] on 04/19/17 when he underwent a biopsy by Dr. Hal Neer indicating a 99 cc gland with 2% of Gleason 3+3=6 adenocarcinoma present in 1 out of 12 biopsy cores. His PSA at the time of diagnosis was 11.0. He did not undergo a confirmatory evaluation but proceeded with PSA monitoring. His PSA increased to 16.9 in March 2023 and he was recommended to undergo further evaluation.   He presented to me initially in June. His PSA at that time was 15.8. An MRI of the prostate was performed on 01/04/22 and demonstrated multiple suspicious lesions. This prompted an MR/US fusion biopsy on 02/02/22. A total of 28 biopsy cores were obtained with 6 positive for malignancy. He did have Gleason 4+4=8 adenocarcinoma present indicating upgraded disease. He has a PSMA PET scan is scheduled to be done tomorrow.   He does have baseline LUTS with an IPSS of 20 on tamsulosin. His prostate volume is now estimated to be almost 200 cc.   He was incidentally noted to have a questionable appendiceal mass on his MRI and this prompted a CT scan of the abdomen and pelvis and confirmed  concern for a mucocele of the appendix. A general surgery referral has been placed. He is scheduled to see Dr. Hassell Done on Thursday for further evaluation.   Family history: None   Imaging studies: PSMA PET imaging -this is scheduled for tomorrow.   PMH: He has a history of TIA (2002), hypertension, glaucoma, PTSD, hepatitis C, GERD, anxiety, depression, and sleep apnea.  PSH: No abdominal surgeries.   TNM stage: cT1 N0 M0  PSA: 15.8  Gleason score: 4+4=8 (GG 4)  Biopsy (02/02/22): 6/28 cores positive  Left: L apex (50%, 4+3=7)  Right: Benign  MR targets: ROI-1 (Benign), ROI-2 (L apex) - 1/4 cores, Gleason 3+4=7, 5%, ROI-3 (L apex/mid) - 2/4 cores, PNI 4+4=8, 70%, 60%, ROI-4 (R base) 2/4 cores, 3+3=6, 5%, 3+4=7, 5%  Prostate volume: 206.9 cc   Urinary function: IPSS is 20.  Erectile function: SHIM score is 1.     ALLERGIES: No Known Drug Allergies    MEDICATIONS: Aspirin 325 mg tablet  Diazepam 10 mg tablet Take 10 mg 30-60 minutes prior to your MRI  Diazepam 10 mg tablet Take 10 mg 30-60 minutes prior to your procedure  Levofloxacin 750 mg tablet Please take one tablet the morning of your biopsy.  Lisinopril 20 mg tablet  Tamsulosin Hcl  Hydroxyzine Hcl  Ibuprofen  Timolol-Latanoprost  Tylenol  Vitamin D2     GU PSH: Locm 300-'399Mg'$ /Ml Iodine,1Ml - 01/25/2022 Prostate Needle Biopsy - 02/02/2022     NON-GU PSH: Eye Surgery (Unspecified) Surgical Pathology, Gross And Microscopic Examination For Prostate Needle - 02/02/2022     GU PMH: Prostate  Cancer - 02/02/2022, - 12/01/2021 Ureteral calculus - 2017 Ureteral obstruction secondary to calculous - 2017 Renal calculus    NON-GU PMH: Neoplasm of uncertain behavior of appendix - 01/25/2022 Anxiety Arthritis Depression GERD Hypertension Personal history of other diseases of the circulatory system Personal history of other diseases of the digestive system Personal history of other diseases of the musculoskeletal system and  connective tissue Personal history of other diseases of the nervous system and sense organs    FAMILY HISTORY: 1 Daughter - No Family History 1 son - Other Gastric Cancer - Mother Lung Cancer - Father   SOCIAL HISTORY: Marital Status: Married Preferred Language: English; Ethnicity: Not Hispanic Or Latino; Race: Black or African American Current Smoking Status: Patient has never smoked.  Does not use smokeless tobacco. Has never drank.  Drinks 1 caffeinated drink per day.    REVIEW OF SYSTEMS:    GU Review Male:   Patient denies frequent urination, hard to postpone urination, burning/ pain with urination, get up at night to urinate, leakage of urine, stream starts and stops, trouble starting your streams, and have to strain to urinate .  Gastrointestinal (Upper):   Patient denies nausea and vomiting.  Gastrointestinal (Lower):   Patient denies diarrhea and constipation.  Constitutional:   Patient denies fever, night sweats, weight loss, and fatigue.  Skin:   Patient denies skin rash/ lesion and itching.  Eyes:   Patient denies blurred vision and double vision.  Ears/ Nose/ Throat:   Patient denies sinus problems and sore throat.  Hematologic/Lymphatic:   Patient denies swollen glands and easy bruising.  Cardiovascular:   Patient denies leg swelling and chest pains.  Respiratory:   Patient denies cough and shortness of breath.  Endocrine:   Patient denies excessive thirst.  Musculoskeletal:   Patient denies back pain and joint pain.  Neurological:   Patient denies headaches and dizziness.  Psychologic:   Patient denies depression and anxiety.   VITAL SIGNS: None   MULTI-SYSTEM PHYSICAL EXAMINATION:    Constitutional: Well-nourished. No physical deformities. Normally developed. Good grooming.     Complexity of Data:  Lab Test Review:   PSA  Records Review:   Pathology Reports, Previous Patient Records   12/01/21  PSA  Total PSA 15.80 ng/mL    PROCEDURES: None    ASSESSMENT:      ICD-10 Details  1 GU:   Prostate Cancer - C61    PLAN:           Document Letter(s):  Created for Patient: Clinical Summary         Notes:   1. High risk prostate cancer: He is scheduled to complete his staging evaluation with a PSMA PET scan tomorrow. He will be informed of these results. Assuming that he does not have metastatic disease, we began discussing options for management in detail today. He is scheduled to see Dr. Tammi Klippel later this afternoon.   The patient was counseled about the natural history of prostate cancer and the standard treatment options that are available for prostate cancer. It was explained to him how his age and life expectancy, clinical stage, Gleason score/prognostic grade group, and PSA (and PSA density) affect his prognosis, the decision to proceed with additional staging studies, as well as how that information influences recommended treatment strategies. We discussed the roles for active surveillance, radiation therapy, surgical therapy, androgen deprivation, as well as ablative therapy and other investigational options for the treatment of prostate cancer as appropriate to his individual  cancer situation. We discussed the risks and benefits of these options with regard to their impact on cancer control and also in terms of potential adverse events, complications, and impact on quality of life particularly related to urinary and sexual function. The patient was encouraged to ask questions throughout the discussion today and all questions were answered to his stated satisfaction. In addition, the patient was provided with and/or directed to appropriate resources and literature for further education about prostate cancer and treatment options.   Considering his age and medical comorbidities, he is not an ideal candidate for surgical therapy and would be at an increased risk for urinary incontinence postoperatively. As such, we have discussed  proceeding with long-term androgen deprivation and radiation therapy is a potentially more viable option for him. He will discuss this further with Dr. Tammi Klippel later this afternoon. If he elects to proceed in this fashion, we will begin androgen deprivation therapy in the near future. This will allow him time to proceed with appendectomy if indicated. He would then not need to start radiation therapy for 2 to 3 months allowing adequate time for recovery from this procedure.   2. BPH/LUTS: He does have an extremely large prostate. He will continue tamsulosin and likely will see some benefit from androgen deprivation therapy as well. We may consider starting finasteride 1 year into his treatment so as to continue combination medical therapy moving forward for management of his lower urinary tract symptoms.   CC: Dr. Delfino Lovett Tisovec  Dr. Johnathan Hausen  Dr. Tyler Pita    E & M CODES: We spent 53 minutes dedicated to evaluation and management time, including face to face interaction, discussions on coordination of care, documentation, result review, and discussion with others as applicable.

## 2022-02-16 NOTE — Progress Notes (Addendum)
                               Care Plan Summary  Name: James Mckinney DOB: 1946-05-14   Your Medical Team:   Urologist -  Dr. Raynelle Bring, [facility]  Radiation Oncologist - Dr. Tyler Pita, [facility]   Medical Oncologist - Dr. Zola Button, [place]  Recommendations: 1) Continue with PSMA PET scan scheduled for 8/23  2) Hormonal therapy  3) Radiation (depending on PSMA PET results)  * These recommendations are based on information available as of today's consult.      Recommendations may change depending on the results of further tests or exams.    Next Steps: 1) PSMA PET scan 8/23  2) You will be contact to start hormonal therapy at [facility]. 3) Radiation will be set up to begin approximately 2 months after start of hormonal therapy.    When appointments need to be scheduled, you will be contacted by [facility] and/or [facility].  Questions?  Please do not hesitate to call Katheren Puller, BSN, RN at [phone] with any questions or concerns.  [name] is your Oncology Nurse Navigator and is available to assist you while you're receiving your medical care at [facility].

## 2022-02-17 ENCOUNTER — Encounter (HOSPITAL_COMMUNITY)
Admission: RE | Admit: 2022-02-17 | Discharge: 2022-02-17 | Disposition: A | Discharge status: Home / Self Care | Payer: Medicare HMO | Source: Ambulatory Visit | Attending: Urology | Admitting: Urology

## 2022-02-17 ENCOUNTER — Other Ambulatory Visit (HOSPITAL_COMMUNITY): Payer: Medicare HMO

## 2022-02-17 DIAGNOSIS — C61 Malignant neoplasm of prostate: Secondary | ICD-10-CM | POA: Insufficient documentation

## 2022-02-17 MED ORDER — PIFLIFOLASTAT F 18 (PYLARIFY) INJECTION
9.0000 | Freq: Once | INTRAVENOUS | Status: AC
  Administered 2022-02-17: 8.92 via INTRAVENOUS

## 2022-02-22 ENCOUNTER — Encounter: Payer: Self-pay | Admitting: Genetic Counselor

## 2022-02-22 DIAGNOSIS — Z8042 Family history of malignant neoplasm of prostate: Secondary | ICD-10-CM

## 2022-02-22 DIAGNOSIS — Z803 Family history of malignant neoplasm of breast: Secondary | ICD-10-CM | POA: Insufficient documentation

## 2022-02-22 HISTORY — DX: Family history of malignant neoplasm of breast: Z80.3

## 2022-02-22 HISTORY — DX: Family history of malignant neoplasm of prostate: Z80.42

## 2022-02-22 NOTE — Progress Notes (Signed)
REFERRING PROVIDER: Raynelle Bring, MD [place],  [place] [id number]  PRIMARY PROVIDER:  Tisovec, Fransico Him, MD  PRIMARY REASON FOR VISIT:  1. Malignant neoplasm of prostate ([place])   2. Family history of prostate cancer   3. Family history of breast cancer     HISTORY OF PRESENT ILLNESS:   James Mckinney, a 76 y.o. male, was seen for a [place] cancer genetics consultation during the prostate multidisciplinary clinic at the request of Dr. Alinda Money due to a personal and family history of cancer.  James Mckinney presents to clinic today to discuss the possibility of a hereditary predisposition to cancer, to discuss genetic testing, and to further clarify his future cancer risks, as well as potential cancer risks for family members.   In 2018, at the age of 42, James Mckinney was diagnosed with adenocarcinoma of the prostate (Gleason 3+3=6). In August 2023, at the age of 27, biopsy indicated Gleason 4+4=8 present, indicating upgraded disease.   CANCER HISTORY:  Oncology History  Malignant neoplasm of prostate ([place])  02/05/2022 Cancer Staging   Staging form: Prostate, AJCC 8th Edition - Clinical stage from 02/05/2022: Stage IIC (cT2a, cN0, cM0, PSA: 18, Grade Group: 4) - Signed by Freeman Caldron, PA-C on 02/16/2022 Histopathologic type: Adenocarcinoma, NOS Stage prefix: Initial diagnosis Prostate specific antigen (PSA) range: 10 to 19 Gleason primary pattern: 4 Gleason secondary pattern: 4 Gleason score: 8 Histologic grading system: 5 grade system Number of biopsy cores examined: 28 Number of biopsy cores positive: 6 Location of positive needle core biopsies: Both sides   02/16/2022 Initial Diagnosis   Malignant neoplasm of prostate [facility])     Past Medical History:  Diagnosis Date   Arthritis    Family history of breast cancer 02/22/2022   Family history of prostate cancer 02/22/2022   Glaucoma    Headache    Hypertension    Kidney stone    Nephrolithiasis 11/2015    Past  Surgical History:  Procedure Laterality Date   NO PAST SURGERIES      FAMILY HISTORY:  We obtained a detailed, 4-generation family history.  Significant diagnoses are listed below: Family History  Problem Relation Age of Onset   Cancer Mother 53       stomach or other primary?   Prostate cancer Father        dx before 40   Lung cancer Father        smoking hx   Multiple myeloma Sister    Breast cancer Sister        dx 70s   Cancer Brother        nasal/sinus?     James Mckinney is unaware of previous family history of genetic testing for hereditary cancer risks. There is no reported Ashkenazi Jewish ancestry. There is no known consanguinity.  GENETIC COUNSELING ASSESSMENT: James Mckinney is a 76 y.o. male with a personal and family history of cancer which is somewhat suggestive of a hereditary cancer syndrome and predisposition to cancer given the presence of related cancers in the family at young ages. We, therefore, discussed and recommended the following at today's visit.   DISCUSSION: We discussed that 5 - 10% of cancer is hereditary.  Most cases of hereditary prostate cancer are associated with mutations in BRCA1/2.  There are other genes that can be associated with hereditary prostate cancer syndromes.  We discussed that testing is beneficial for several reasons including knowing how to follow individuals for their cancer risks, identifying whether  potential treatment options, such as PARP inhibitors, could be beneficial, and understanding if other family members could be at risk for cancer and allowing them to undergo genetic testing.   We reviewed the characteristics, features and inheritance patterns of hereditary cancer syndromes. We also discussed genetic testing, including the appropriate family members to test, the process of testing, insurance coverage and turn-around-time for results. We discussed the implications of a negative, positive, carrier and/or variant of uncertain  significant result. We recommended James Mckinney pursue genetic testing for a panel that includes genes associated with prostate, breast, and other cancers.   The CancerNext-Expanded gene panel offered by [facility] and includes sequencing, rearrangement, and RNA analysis for the following 77 genes: AIP, ALK, APC, ATM, AXIN2, BAP1, BARD1, BLM, BMPR1A, BRCA1, BRCA2, BRIP1, CDC73, CDH1, CDK4, CDKN1B, CDKN2A, CHEK2, CTNNA1, DICER1, FANCC, FH, FLCN, GALNT12, KIF1B, LZTR1, MAX, MEN1, MET, MLH1, MSH2, MSH3, MSH6, MUTYH, NBN, NF1, NF2, NTHL1, PALB2, PHOX2B, PMS2, POT1, PRKAR1A, PTCH1, PTEN, RAD51C, RAD51D, RB1, RECQL, RET, SDHA, SDHAF2, SDHB, SDHC, SDHD, SMAD4, SMARCA4, SMARCB1, SMARCE1, STK11, SUFU, TMEM127, TP53, TSC1, TSC2, VHL and XRCC2 (sequencing and deletion/duplication); EGFR, EGLN1, HOXB13, KIT, MITF, PDGFRA, POLD1, and POLE (sequencing only); EPCAM and GREM1 (deletion/duplication only).   Based on James Mckinney's personal and family history of cancer, he meets medical criteria for genetic testing. Despite that he meets criteria, he may still have an out of pocket cost. We discussed that if his out of pocket cost for testing is over $100, the laboratory should contact him and discuss the self-pay prices and/or patient pay assistance programs.    PLAN: After considering the risks, benefits, and limitations, James Mckinney provided informed consent to pursue genetic testing and the blood sample was sent to [facility] for analysis of the CancerNext-Expanded +RNAinsight. Results should be available within approximately 3 weeks' time, at which point they will be disclosed by telephone to James Mckinney, as will any additional recommendations warranted by these results. James Mckinney will receive a summary of his genetic counseling visit and a copy of his results once available. This information will also be available in Epic.   James Mckinney questions were answered to his satisfaction today. Our contact information  was provided should additional questions or concerns arise. Thank you for the referral and allowing [place] to share in the care of your patient.   Kiari Hosmer M. Joette Catching, [place], [facility] Genetic Counselor [email or web address] ([phone]  The patient was seen for a total of 15 minutes in face-to-face genetic counseling.  He was accompanied by his wife, Rosetta. Drs. Lindi Adie and/or Burr Medico were available to discuss this case as needed.    _______________________________________________________________________ For Office Staff:  Number of people involved in session: 2 Was an Intern/ student involved with case: no

## 2022-02-23 DIAGNOSIS — C61 Malignant neoplasm of prostate: Secondary | ICD-10-CM | POA: Diagnosis not present

## 2022-02-23 NOTE — Progress Notes (Signed)
Patient presented to [facility] on 02/16/2022 for stage T2a adenocarcinoma of the prostate with a Gleason score of 4+4 and a PSA of 18.02.   Patient has since undergone PSMA PET, and treatment plan for ADT followed by radiation has been finalized. Patient started ADT, [place], on 8/29.    RN spoke with patient to assess any navigation needs.  Education provided on next steps:  Follow up with Dr. Rosana Hoes on 9/13 to address appendiccal mucocele, based on Dr. Rosana Hoes recommendations will determine start of radiation therapy.  Pt verbalized understanding.  No further needs at this time, will continue to follow.

## 2022-02-26 DIAGNOSIS — C181 Malignant neoplasm of appendix: Secondary | ICD-10-CM

## 2022-02-26 HISTORY — DX: Malignant neoplasm of appendix: C18.1

## 2022-03-10 ENCOUNTER — Ambulatory Visit: Payer: Self-pay | Admitting: Surgery

## 2022-03-10 DIAGNOSIS — K388 Other specified diseases of appendix: Secondary | ICD-10-CM

## 2022-03-11 NOTE — Progress Notes (Signed)
COVID Vaccine Completed:  Date of COVID positive in last 90 days:  PCP - Domenick Gong, MD Cardiologist -   Chest x-ray -  EKG -  Stress Test -  ECHO - 01/05/21 Epic Cardiac Cath -  Pacemaker/ICD device last checked: Spinal Cord Stimulator:  Bowel Prep -   Sleep Study -  CPAP -   Fasting Blood Sugar -  Checks Blood Sugar _____ times a day  Blood Thinner Instructions: Aspirin Instructions: Last Dose:  Activity level:  Can go up a flight of stairs and perform activities of daily living without stopping and without symptoms of chest pain or shortness of breath.  Able to exercise without symptoms  Unable to go up a flight of stairs without symptoms of     Anesthesia review:   Patient denies shortness of breath, fever, cough and chest pain at PAT appointment  Patient verbalized understanding of instructions that were given to them at the PAT appointment. Patient was also instructed that they will need to review over the PAT instructions again at home before surgery.

## 2022-03-11 NOTE — Patient Instructions (Signed)
SURGICAL WAITING ROOM VISITATION Patients having surgery or a procedure may have no more than 2 support people in the waiting area - these visitors may rotate.   Children under the age of 69 must have an adult with them who is not the patient. If the patient needs to stay at the hospital during part of their recovery, the visitor guidelines for inpatient rooms apply. Pre-op nurse will coordinate an appropriate time for 1 support person to accompany patient in pre-op.  This support person may not rotate.    Please refer to the [facility] website for the visitor guidelines for Inpatients (after your surgery is over and you are in a regular room).    Your procedure is scheduled on: 03/16/22   Report to [facility] Main Entrance    Report to admitting at 11:45 AM   Call this number if you have problems the morning of surgery [phone]   Do not eat food :After Midnight.   After Midnight you may have the following liquids until 11:00 AM DAY OF SURGERY  Water Non-Citrus Juices (without pulp, NO RED) Carbonated Beverages Black Coffee (NO MILK/CREAM OR CREAMERS, sugar ok)  Clear Tea (NO MILK/CREAM OR CREAMERS, sugar ok) regular and decaf                             Plain Jell-O (NO RED)                                           Fruit ices (not with fruit pulp, NO RED)                                     Popsicles (NO RED)                                                               Sports drinks like Gatorade (NO RED)                 The day of surgery:  Drink ONE (1) Pre-Surgery Clear Ensure at 11:00 AM the morning of surgery. Drink in one sitting. Do not sip.  This drink was given to you during your hospital  pre-op appointment visit. Nothing else to drink after completing the  Pre-Surgery Clear Ensure.          If you have questions, please contact your surgeon's office.   FOLLOW BOWEL PREP AND ANY ADDITIONAL PRE OP INSTRUCTIONS YOU RECEIVED FROM YOUR SURGEON'S  OFFICE!!!     Oral Hygiene is also important to reduce your risk of infection.                                    Remember - BRUSH YOUR TEETH THE MORNING OF SURGERY WITH YOUR REGULAR TOOTHPASTE   Take these medicines the morning of surgery with A SIP OF WATER: Tylenol, Rosuvastatin  Bring CPAP mask and tubing day of surgery.  You may not have any metal on your body including jewelry, and body piercing             Do not wear lotions, powders, cologne, or deodorant              Men may shave face and neck.   Do not bring valuables to the hospital. [place].   Bring small overnight bag day of surgery.   DO NOT [place]. PHARMACY WILL DISPENSE MEDICATIONS LISTED ON YOUR MEDICATION LIST TO YOU DURING YOUR ADMISSION [place]!               Please read over the following fact sheets you were given: IF YOU HAVE QUESTIONS ABOUT YOUR PRE-OP INSTRUCTIONS PLEASE CALL [place] - Preparing for Surgery Before surgery, you can play an important role.  Because skin is not sterile, your skin needs to be as free of germs as possible.  You can reduce the number of germs on your skin by washing with CHG (chlorahexidine gluconate) soap before surgery.  CHG is an antiseptic cleaner which kills germs and bonds with the skin to continue killing germs even after washing. Please DO NOT use if you have an allergy to CHG or antibacterial soaps.  If your skin becomes reddened/irritated stop using the CHG and inform your nurse when you arrive at Short Stay. Do not shave (including legs and underarms) for at least 48 hours prior to the first CHG shower.  You may shave your face/neck.  Please follow these instructions carefully:  1.  Shower with CHG Soap the night before surgery and the  morning of surgery.  2.  If you choose to wash your hair, wash your hair first as  usual with your normal  shampoo.  3.  After you shampoo, rinse your hair and body thoroughly to remove the shampoo.                             4.  Use CHG as you would any other liquid soap.  You can apply chg directly to the skin and wash.  Gently with a scrungie or clean washcloth.  5.  Apply the CHG Soap to your body ONLY FROM THE NECK DOWN.   Do   not use on face/ open                           Wound or open sores. Avoid contact with eyes, ears mouth and   genitals (private parts).                       Wash face,  Genitals (private parts) with your normal soap.             6.  Wash thoroughly, paying special attention to the area where your    surgery  will be performed.  7.  Thoroughly rinse your body with warm water from the neck down.  8.  DO NOT shower/wash with your normal soap after using and rinsing off the CHG Soap.                9.  Pat yourself dry with a clean towel.  10.  Wear clean pajamas.            11.  Place clean sheets on your bed the night of your first shower and do not  sleep with pets. Day of Surgery : Do not apply any lotions/deodorants the morning of surgery.  Please wear clean clothes to the hospital/surgery center.  FAILURE TO FOLLOW THESE INSTRUCTIONS MAY RESULT IN THE CANCELLATION OF YOUR SURGERY  PATIENT SIGNATURE_________________________________  NURSE SIGNATURE__________________________________  ________________________________________________________________________

## 2022-03-11 NOTE — Progress Notes (Signed)
Patient recently saw Dr. Hassell Done on 9/13 to discuss the incidental finding of mucocele of the appendix.  Patient will be getting scheduled for a laparoscopic appendectomy, pending date at this time.    RN spoke with patient to confirm treatment plan.  Pt verbalized understanding that radiation will start after recovery from  laparoscopic.   RN will continue to monitor to confirm surgery date and coordinate for radiation.

## 2022-03-12 ENCOUNTER — Encounter (HOSPITAL_COMMUNITY): Payer: Self-pay

## 2022-03-12 ENCOUNTER — Encounter (HOSPITAL_COMMUNITY)
Admission: RE | Admit: 2022-03-12 | Discharge: 2022-03-12 | Disposition: A | Discharge status: Home / Self Care | Payer: Medicare HMO | Source: Ambulatory Visit | Attending: Surgery | Admitting: Surgery

## 2022-03-12 VITALS — BP 151/85 | HR 74 | Temp 98.3°F | Resp 14 | Ht 72.0 in | Wt 251.4 lb

## 2022-03-12 DIAGNOSIS — I10 Essential (primary) hypertension: Secondary | ICD-10-CM | POA: Insufficient documentation

## 2022-03-12 DIAGNOSIS — Z01818 Encounter for other preprocedural examination: Secondary | ICD-10-CM | POA: Diagnosis not present

## 2022-03-12 DIAGNOSIS — K388 Other specified diseases of appendix: Secondary | ICD-10-CM | POA: Diagnosis not present

## 2022-03-12 HISTORY — DX: Depression, unspecified: F32.A

## 2022-03-12 HISTORY — DX: Anemia, unspecified: D64.9

## 2022-03-12 HISTORY — DX: Gastro-esophageal reflux disease without esophagitis: K21.9

## 2022-03-12 HISTORY — DX: Cerebral infarction, unspecified: I63.9

## 2022-03-12 HISTORY — DX: Pneumonia, unspecified organism: J18.9

## 2022-03-12 HISTORY — DX: Post-traumatic stress disorder, unspecified: F43.10

## 2022-03-12 HISTORY — DX: Anxiety disorder, unspecified: F41.9

## 2022-03-12 LAB — BASIC METABOLIC PANEL
Anion gap: 5 (ref 5–15)
BUN: 14 mg/dL (ref 8–23)
CO2: 28 mmol/L (ref 22–32)
Calcium: 9.7 mg/dL (ref 8.9–10.3)
Chloride: 108 mmol/L (ref 98–111)
Creatinine, Ser: 1.16 mg/dL (ref 0.61–1.24)
GFR, Estimated: >60 mL/min (ref >60)
Glucose, Bld: 101 mg/dL — ABNORMAL HIGH (ref 70–99)
Potassium: 4.1 mmol/L (ref 3.5–5.1)
Sodium: 141 mmol/L (ref 135–145)

## 2022-03-12 LAB — CBC
HCT: 52 % (ref 39.0–52.0)
Hemoglobin: 16.6 g/dL (ref 13.0–17.0)
MCH: 28 pg (ref 26.0–34.0)
MCHC: 31.9 g/dL (ref 30.0–36.0)
MCV: 87.8 fL (ref 80.0–100.0)
Platelets: 160 10*3/uL (ref 150–400)
RBC: 5.92 MIL/uL — ABNORMAL HIGH (ref 4.22–5.81)
RDW: 14 % (ref 11.5–15.5)
WBC: 8 10*3/uL (ref 4.0–10.5)
nRBC: 0 % (ref 0.0–0.2)

## 2022-03-14 LAB — CEA: CEA: 2.1 ng/mL (ref 0.0–4.7)

## 2022-03-15 NOTE — H&P (Signed)
REFERRING PHYSICIAN: Elisabeth Pigeon*  PROVIDER: Joya San, MD  MRN: [id number] DOB: [birth year] DATE OF ENCOUNTER: 03/10/2022  Subjective   Chief Complaint: New Consultation (Mucocele appendix)   History of Present Illness: James Mckinney is a 76 y.o. male who is seen today as an office consultation at the request of Dr. Alinda Money for evaluation of New Consultation (Mucocele appendix) .   Mr and Mrs Weatherspoon were seen in the [facility] office this morning regarding a mucocele of the appendix seen on a CT scan. He is a [place] veteran and was followed in the [place] and had a prostate biopsy done around 2017 and his follow-up was disrupted by COVID. He was subsequently seen back there and was alerted to the fact that his PSA was quite elevated and he went to see Dr. Alinda Money. He had a CT scan performed on July 31 and that showed a dilated appendix measuring up to 1.4 cm in diameter with findings consistent with a new appendiceal mucocele. I explained to them that there is no test that we can use to determine whether this is benign or have malignant potential. I explained how we do this laparoscopically and he indicated he wanted to proceed with having it removed. He is scheduled to have radiation on his prostate cancer beginning in October. Ideally we could try to get him on for a laparoscopic appendectomy in the next week and potentially have him healed so that this does not delay his radiation therapy.  He has glaucoma and has been injected at the [place]. Following that change in medication she did have a little stroke related to that and they have changed his medications. Now he takes drops for that. He has never had any abdominal surgery. Denies any history of deep venous thrombosis.  We will go ahead and get him scheduled for a laparoscopic appendectomy soon.  Review of Systems: See HPI as well for other ROS.  ROS   Medical History: Past Medical History:  Diagnosis Date   Glaucoma (increased eye pressure)  History of cancer  Hyperlipidemia  Hypertension   Patient Active Problem List  Diagnosis  Constipation  Essential hypertension  Exposure to Agent Orange  Family history of breast cancer  Family history of prostate cancer  Glaucoma  Hydronephrosis  Ureteral stone with hydronephrosis  Leukocytosis  Malignant neoplasm of prostate (CMS-HCC)  Primary open angle glaucoma (POAG) of both eyes, severe stage  AKI (acute kidney injury) (CMS-HCC)   History reviewed. No pertinent surgical history.   No Known Allergies  Current Outpatient Medications on File Prior to Visit  Medication Sig Dispense Refill  ALPRAZolam (XANAX) 0.5 MG tablet Take 2 tablets approximately 45 minutes prior to the MRI study, take a third tablet if needed.  aspirin 325 MG tablet Take by mouth  brimonidine (ALPHAGAN) 0.2 % ophthalmic solution Apply 1 drop to eye 2 (two) times daily  dorzolamide-timoloL (COSOPT) 22.3-6.8 mg/mL ophthalmic solution Place 1 drop into both eyes 2 (two) times daily  latanoprost (XALATAN) 0.005 % ophthalmic solution Place 1 drop into both eyes at bedtime  lisinopriL (ZESTRIL) 20 MG tablet Take 20 mg by mouth once daily  rosuvastatin (CRESTOR) 10 MG tablet Take 10 mg by mouth once daily   No current facility-administered medications on file prior to visit.   Family History  Problem Relation Age of Onset  Prostate cancer Father  Diabetes Father  Lung cancer Father  Breast cancer Sister  Multiple myeloma Sister    Social History  Tobacco Use  Smoking Status Never  Smokeless Tobacco Never    Social History   Socioeconomic History  Marital status: Married  Tobacco Use  Smoking status: Never  Smokeless tobacco: Never  Vaping Use  Vaping Use: Never used  Substance and Sexual Activity  Alcohol use: Not Currently  Drug use: Never   Objective:   Vitals:  03/10/22 0939  BP: 118/76  Pulse: 82  Temp: 36.8 C (98.2 F)  SpO2: 98%   Weight: (!) 115.2 kg (254 lb)  Height: 185.4 cm ($RemoveB'6\' 1"'mYnnetsF$ )   Body mass index is 33.51 kg/m.  Physical Exam General: Well maintained African-American male. HEENT : Underlying glaucoma no eye exam done. No bruits heard in his neck Chest: Clear Heart: Sinus rhythm without murmurs or gallops Breast: Not examined Abdomen: Protuberant nontender. GU not examined Rectal not performed Extremities full range of motion no history of DVT Neuro alert and oriented x3. Motor and sensory function grossly intact  Labs, Imaging and Diagnostic Testing: CT scan report was reviewed dated 01/25/2022  Assessment and Plan:  Diagnoses and all orders for this visit:  Mucocele of appendix    We will schedule laparoscopic appendectomy  No follow-ups on file.  Janya Eveland Donia Pounds, MD

## 2022-03-16 ENCOUNTER — Ambulatory Visit (HOSPITAL_BASED_OUTPATIENT_CLINIC_OR_DEPARTMENT_OTHER): Payer: Medicare HMO | Admitting: Anesthesiology

## 2022-03-16 ENCOUNTER — Other Ambulatory Visit: Payer: Self-pay

## 2022-03-16 ENCOUNTER — Ambulatory Visit (HOSPITAL_COMMUNITY): Payer: Medicare HMO | Admitting: Anesthesiology

## 2022-03-16 ENCOUNTER — Observation Stay (HOSPITAL_COMMUNITY)
Admission: RE | Admit: 2022-03-16 | Discharge: 2022-03-17 | Disposition: A | Discharge status: Home / Self Care | Payer: Medicare HMO | Attending: Surgery | Admitting: Surgery

## 2022-03-16 ENCOUNTER — Encounter (HOSPITAL_COMMUNITY): Payer: Self-pay | Admitting: Surgery

## 2022-03-16 ENCOUNTER — Encounter (HOSPITAL_COMMUNITY)
Admission: RE | Disposition: A | Discharge status: Home / Self Care | Payer: Self-pay | Source: Home / Self Care | Attending: Surgery

## 2022-03-16 DIAGNOSIS — K36 Other appendicitis: Secondary | ICD-10-CM

## 2022-03-16 DIAGNOSIS — I10 Essential (primary) hypertension: Secondary | ICD-10-CM | POA: Insufficient documentation

## 2022-03-16 DIAGNOSIS — C181 Malignant neoplasm of appendix: Principal | ICD-10-CM | POA: Insufficient documentation

## 2022-03-16 DIAGNOSIS — Z8546 Personal history of malignant neoplasm of prostate: Secondary | ICD-10-CM | POA: Diagnosis not present

## 2022-03-16 DIAGNOSIS — Z79899 Other long term (current) drug therapy: Secondary | ICD-10-CM | POA: Diagnosis not present

## 2022-03-16 DIAGNOSIS — Z7982 Long term (current) use of aspirin: Secondary | ICD-10-CM | POA: Diagnosis not present

## 2022-03-16 DIAGNOSIS — M1990 Unspecified osteoarthritis, unspecified site: Secondary | ICD-10-CM

## 2022-03-16 DIAGNOSIS — F418 Other specified anxiety disorders: Secondary | ICD-10-CM | POA: Diagnosis not present

## 2022-03-16 DIAGNOSIS — K388 Other specified diseases of appendix: Secondary | ICD-10-CM | POA: Diagnosis not present

## 2022-03-16 HISTORY — PX: LAPAROSCOPIC APPENDECTOMY: SHX408

## 2022-03-16 LAB — CREATININE, SERUM
Creatinine, Ser: 1.3 mg/dL — ABNORMAL HIGH (ref 0.61–1.24)
GFR, Estimated: 57 mL/min — ABNORMAL LOW (ref >60)

## 2022-03-16 LAB — CBC
HCT: 52.1 % — ABNORMAL HIGH (ref 39.0–52.0)
Hemoglobin: 16.4 g/dL (ref 13.0–17.0)
MCH: 27.8 pg (ref 26.0–34.0)
MCHC: 31.5 g/dL (ref 30.0–36.0)
MCV: 88.3 fL (ref 80.0–100.0)
Platelets: 146 10*3/uL — ABNORMAL LOW (ref 150–400)
RBC: 5.9 MIL/uL — ABNORMAL HIGH (ref 4.22–5.81)
RDW: 14 % (ref 11.5–15.5)
WBC: 15 10*3/uL — ABNORMAL HIGH (ref 4.0–10.5)
nRBC: 0 % (ref 0.0–0.2)

## 2022-03-16 SURGERY — APPENDECTOMY, LAPAROSCOPIC
Anesthesia: General

## 2022-03-16 MED ORDER — ROSUVASTATIN CALCIUM 10 MG PO TABS
10.0000 mg | ORAL_TABLET | Freq: Every day | ORAL | Status: DC
  Administered 2022-03-17: 10 mg via ORAL
  Filled 2022-03-16: qty 1

## 2022-03-16 MED ORDER — CHLORHEXIDINE GLUCONATE CLOTH 2 % EX PADS: 6.0000 | MEDICATED_PAD | Freq: Once | CUTANEOUS | Status: DC

## 2022-03-16 MED ORDER — OXYCODONE HCL 5 MG/5ML PO SOLN: 5.0000 mg | Freq: Once | ORAL | Status: AC | PRN

## 2022-03-16 MED ORDER — LACTATED RINGERS IV SOLN: INTRAVENOUS | Status: DC

## 2022-03-16 MED ORDER — LIDOCAINE 2% (20 MG/ML) 5 ML SYRINGE
INTRAMUSCULAR | Status: DC | PRN
  Administered 2022-03-16: 60 mg via INTRAVENOUS

## 2022-03-16 MED ORDER — PANTOPRAZOLE SODIUM 40 MG IV SOLR
40.0000 mg | Freq: Every day | INTRAVENOUS | Status: DC
  Administered 2022-03-16: 40 mg via INTRAVENOUS
  Filled 2022-03-16: qty 10

## 2022-03-16 MED ORDER — MIDAZOLAM HCL 2 MG/2ML IJ SOLN
INTRAMUSCULAR | Status: DC | PRN
  Administered 2022-03-16: 2 mg via INTRAVENOUS

## 2022-03-16 MED ORDER — BUPIVACAINE LIPOSOME 1.3 % IJ SUSP
INTRAMUSCULAR | Status: DC | PRN
  Administered 2022-03-16: 20 mL

## 2022-03-16 MED ORDER — OXYCODONE HCL 5 MG PO TABS
ORAL_TABLET | ORAL | Status: AC
  Filled 2022-03-16: qty 1

## 2022-03-16 MED ORDER — DORZOLAMIDE HCL 2 % OP SOLN
1.0000 [drp] | Freq: Two times a day (BID) | OPHTHALMIC | Status: DC
  Administered 2022-03-16 – 2022-03-17 (×2): 1 [drp] via OPHTHALMIC
  Filled 2022-03-16: qty 10

## 2022-03-16 MED ORDER — SODIUM CHLORIDE 0.9 % IV SOLN
2.0000 g | INTRAVENOUS | Status: AC
  Administered 2022-03-16: 2 g via INTRAVENOUS
  Filled 2022-03-16: qty 2

## 2022-03-16 MED ORDER — ROCURONIUM BROMIDE 10 MG/ML (PF) SYRINGE
PREFILLED_SYRINGE | INTRAVENOUS | Status: DC | PRN
  Administered 2022-03-16: 20 mg via INTRAVENOUS
  Administered 2022-03-16: 60 mg via INTRAVENOUS
  Administered 2022-03-16: 10 mg via INTRAVENOUS

## 2022-03-16 MED ORDER — ONDANSETRON HCL 4 MG/2ML IJ SOLN
INTRAMUSCULAR | Status: DC | PRN
  Administered 2022-03-16: 4 mg via INTRAVENOUS

## 2022-03-16 MED ORDER — PROMETHAZINE HCL 25 MG/ML IJ SOLN: 6.2500 mg | INTRAMUSCULAR | Status: DC | PRN

## 2022-03-16 MED ORDER — HEPARIN SODIUM (PORCINE) 5000 UNIT/ML IJ SOLN
5000.0000 [IU] | Freq: Three times a day (TID) | INTRAMUSCULAR | Status: DC
  Administered 2022-03-17: 5000 [IU] via SUBCUTANEOUS
  Filled 2022-03-16: qty 1

## 2022-03-16 MED ORDER — PROPOFOL 10 MG/ML IV BOLUS
INTRAVENOUS | Status: DC | PRN
  Administered 2022-03-16: 150 mg via INTRAVENOUS

## 2022-03-16 MED ORDER — MORPHINE SULFATE (PF) 2 MG/ML IV SOLN: 1.0000 mg | INTRAVENOUS | Status: DC | PRN

## 2022-03-16 MED ORDER — OXYCODONE HCL 5 MG PO TABS
5.0000 mg | ORAL_TABLET | Freq: Once | ORAL | Status: AC | PRN
  Administered 2022-03-16: 5 mg via ORAL

## 2022-03-16 MED ORDER — METOPROLOL TARTRATE 5 MG/5ML IV SOLN: 5.0000 mg | Freq: Four times a day (QID) | INTRAVENOUS | Status: DC | PRN

## 2022-03-16 MED ORDER — ONDANSETRON HCL 4 MG/2ML IJ SOLN: 4.0000 mg | Freq: Four times a day (QID) | INTRAMUSCULAR | Status: DC | PRN

## 2022-03-16 MED ORDER — ONDANSETRON 4 MG PO TBDP: 4.0000 mg | ORAL_TABLET | Freq: Four times a day (QID) | ORAL | Status: DC | PRN

## 2022-03-16 MED ORDER — LACTATED RINGERS IV SOLN
INTRAVENOUS | Status: DC | PRN
  Administered 2022-03-16: 1000 mL

## 2022-03-16 MED ORDER — CHLORHEXIDINE GLUCONATE 0.12 % MT SOLN
15.0000 mL | Freq: Once | OROMUCOSAL | Status: AC
  Administered 2022-03-16: 15 mL via OROMUCOSAL

## 2022-03-16 MED ORDER — HYDROMORPHONE HCL 1 MG/ML IJ SOLN: 0.2500 mg | INTRAMUSCULAR | Status: DC | PRN

## 2022-03-16 MED ORDER — BRIMONIDINE TARTRATE 0.2 % OP SOLN
1.0000 [drp] | Freq: Two times a day (BID) | OPHTHALMIC | Status: DC
  Administered 2022-03-16 – 2022-03-17 (×2): 1 [drp] via OPHTHALMIC
  Filled 2022-03-16: qty 5

## 2022-03-16 MED ORDER — BUPIVACAINE LIPOSOME 1.3 % IJ SUSP
INTRAMUSCULAR | Status: AC
  Filled 2022-03-16: qty 20

## 2022-03-16 MED ORDER — LISINOPRIL 20 MG PO TABS
20.0000 mg | ORAL_TABLET | Freq: Every day | ORAL | Status: DC
  Administered 2022-03-17: 20 mg via ORAL
  Filled 2022-03-16: qty 1

## 2022-03-16 MED ORDER — BUPIVACAINE LIPOSOME 1.3 % IJ SUSP: 20.0000 mL | Freq: Once | INTRAMUSCULAR | Status: DC

## 2022-03-16 MED ORDER — SUGAMMADEX SODIUM 500 MG/5ML IV SOLN
INTRAVENOUS | Status: DC | PRN
  Administered 2022-03-16: 250 mg via INTRAVENOUS

## 2022-03-16 MED ORDER — MIDAZOLAM HCL 2 MG/2ML IJ SOLN
INTRAMUSCULAR | Status: AC
  Filled 2022-03-16: qty 2

## 2022-03-16 MED ORDER — FENTANYL CITRATE (PF) 250 MCG/5ML IJ SOLN
INTRAMUSCULAR | Status: AC
  Filled 2022-03-16: qty 5

## 2022-03-16 MED ORDER — AMISULPRIDE (ANTIEMETIC) 5 MG/2ML IV SOLN: 10.0000 mg | Freq: Once | INTRAVENOUS | Status: DC | PRN

## 2022-03-16 MED ORDER — FENTANYL CITRATE (PF) 250 MCG/5ML IJ SOLN
INTRAMUSCULAR | Status: DC | PRN
  Administered 2022-03-16: 50 ug via INTRAVENOUS
  Administered 2022-03-16 (×2): 100 ug via INTRAVENOUS

## 2022-03-16 MED ORDER — ORAL CARE MOUTH RINSE: 15.0000 mL | Freq: Once | OROMUCOSAL | Status: AC

## 2022-03-16 MED ORDER — SODIUM CHLORIDE 0.9 % IV SOLN
2.0000 g | Freq: Two times a day (BID) | INTRAVENOUS | Status: AC
  Administered 2022-03-17: 2 g via INTRAVENOUS
  Filled 2022-03-16: qty 2

## 2022-03-16 MED ORDER — HYDROCODONE-ACETAMINOPHEN 5-325 MG PO TABS
1.0000 | ORAL_TABLET | ORAL | Status: DC | PRN
  Administered 2022-03-16: 2 via ORAL
  Filled 2022-03-16: qty 2

## 2022-03-16 MED ORDER — ROCURONIUM BROMIDE 10 MG/ML (PF) SYRINGE
PREFILLED_SYRINGE | INTRAVENOUS | Status: AC
  Filled 2022-03-16: qty 10

## 2022-03-16 MED ORDER — SUGAMMADEX SODIUM 500 MG/5ML IV SOLN
INTRAVENOUS | Status: AC
  Filled 2022-03-16: qty 5

## 2022-03-16 MED ORDER — LATANOPROST 0.005 % OP SOLN
1.0000 [drp] | Freq: Every day | OPHTHALMIC | Status: DC
  Administered 2022-03-16: 1 [drp] via OPHTHALMIC
  Filled 2022-03-16: qty 2.5

## 2022-03-16 SURGICAL SUPPLY — 49 items
ADH SKN CLS APL DERMABOND .7 (GAUZE/BANDAGES/DRESSINGS) ×1
APL PRP STRL LF DISP 70% ISPRP (MISCELLANEOUS) ×1
APPLIER CLIP ROT 10 11.4 M/L (STAPLE)
APR CLP MED LRG 11.4X10 (STAPLE)
BAG COUNTER SPONGE SURGICOUNT (BAG) IMPLANT
BAG SPEC RTRVL 10 TROC 200 (ENDOMECHANICALS)
BAG SPNG CNTER NS LX DISP (BAG)
CABLE HIGH FREQUENCY MONO STRZ (ELECTRODE) IMPLANT
CHLORAPREP W/TINT 26 (MISCELLANEOUS) ×1 IMPLANT
CLIP APPLIE ROT 10 11.4 M/L (STAPLE) IMPLANT
COVER SURGICAL LIGHT HANDLE (MISCELLANEOUS) ×1 IMPLANT
CUTTER FLEX LINEAR 45M (STAPLE) ×1 IMPLANT
DERMABOND ADVANCED .7 DNX12 (GAUZE/BANDAGES/DRESSINGS) ×1 IMPLANT
DRAPE LAPAROSCOPIC ABDOMINAL (DRAPES) IMPLANT
ELECT REM PT RETURN 15FT ADLT (MISCELLANEOUS) ×1 IMPLANT
ENDOLOOP SUT PDS II  0 18 (SUTURE)
ENDOLOOP SUT PDS II 0 18 (SUTURE) IMPLANT
GLOVE SURG LX STRL 8.0 MICRO (GLOVE) ×1 IMPLANT
GOWN STRL REUS W/ TWL XL LVL3 (GOWN DISPOSABLE) ×1 IMPLANT
GOWN STRL REUS W/TWL XL LVL3 (GOWN DISPOSABLE) ×1
IRRIG SUCT STRYKERFLOW 2 WTIP (MISCELLANEOUS) ×1
IRRIGATION SUCT STRKRFLW 2 WTP (MISCELLANEOUS) ×1 IMPLANT
KIT BASIN OR (CUSTOM PROCEDURE TRAY) ×1 IMPLANT
KIT TURNOVER KIT A (KITS) IMPLANT
PAD POSITIONING PINK XL (MISCELLANEOUS) ×1 IMPLANT
PENCIL SMOKE EVACUATOR (MISCELLANEOUS) IMPLANT
POUCH RETRIEVAL ECOSAC 10 (ENDOMECHANICALS) IMPLANT
POUCH RETRIEVAL ECOSAC 10MM (ENDOMECHANICALS)
RELOAD 45 VASCULAR/THIN (ENDOMECHANICALS) IMPLANT
RELOAD STAPLE 45 2.5 WHT GRN (ENDOMECHANICALS) IMPLANT
RELOAD STAPLE 45 3.5 BLU ETS (ENDOMECHANICALS) IMPLANT
RELOAD STAPLE TA45 3.5 REG BLU (ENDOMECHANICALS) ×2 IMPLANT
SCISSORS LAP 5X45 EPIX DISP (ENDOMECHANICALS) ×1 IMPLANT
SET TUBE SMOKE EVAC HIGH FLOW (TUBING) ×1 IMPLANT
SHEARS HARMONIC ACE PLUS 45CM (MISCELLANEOUS) ×1 IMPLANT
SLEEVE Z-THREAD 5X100MM (TROCAR) ×1 IMPLANT
SPIKE FLUID TRANSFER (MISCELLANEOUS) IMPLANT
STAPLER VISISTAT 35W (STAPLE) IMPLANT
SUT MNCRL AB 4-0 PS2 18 (SUTURE) ×1 IMPLANT
SUT VICRYL 0 UR6 27IN ABS (SUTURE) IMPLANT
SYS BAG RETRIEVAL 10MM (BASKET) ×1
SYSTEM BAG RETRIEVAL 10MM (BASKET) ×1 IMPLANT
TOWEL OR 17X26 10 PK STRL BLUE (TOWEL DISPOSABLE) ×1 IMPLANT
TRAY FOLEY MTR SLVR 14FR STAT (SET/KITS/TRAYS/PACK) IMPLANT
TRAY FOLEY MTR SLVR 16FR STAT (SET/KITS/TRAYS/PACK) IMPLANT
TRAY LAPAROSCOPIC (CUSTOM PROCEDURE TRAY) ×1 IMPLANT
TROCAR 11X100 Z THREAD (TROCAR) IMPLANT
TROCAR BALLN 12MMX100 BLUNT (TROCAR) ×1 IMPLANT
TROCAR Z-THREAD OPTICAL 5X100M (TROCAR) ×1 IMPLANT

## 2022-03-16 NOTE — Op Note (Signed)
James Mckinney  Nov 11, 1945   03/16/2022    PCP:  Haywood Pao, MD   Surgeon: Kaylyn Lim, MD, FACS  Asst:  none  Anes:  general  Preop Dx: Dilated appendix on CT--possible mucocele Postop Dx: Chronic retrocecal appendicitis  Procedure: Laparoscopic appendectomy Location Surgery: [facility] Complications:  None noted  EBL:   10 cc  Drains: none  Description of Procedure:  The patient was taken to OR 3 .  After anesthesia was administered and the patient was prepped  with chloroprep  and a timeout was performed.  Access to the abdomen was achieved cutting down into the umbilicus and entering the abdomen with mild blunt dissection and placing a holding suture to hold the Hassan 12 mm trocar.  A 5 was placed in the right upper quadrant and left lower quadrant obliquely.  No Foley was placed because of his underlying prostate cancer.  The cecum appeared to be chronically inflamed with a lot of adhesions to the cecal cecum with to the anterior abdominal wall.  These were stringy and I divided those with the harmonic scalpel.  The appendix was noted in it and it dove retrocecally and it was very fixed in the retrocecal region.  I then mobilized the right colon and then worked my way down but the tip of the appendix was not good to mobilize easily.  I went ahead and dissected proximally and created a window on the proximal appendix where I could then staple across the base of the of the cecum getting some cecum with the appendix and I fired the blue load Ethicon 4.5 cm stapler twice and got across that.  I then went through the mesentery of the appendix with the harmonic scalpel.  Because it was so stuck I felt like this likely represented an unrecognized appendicitis that was retrocecal.  Identified the ureter distally and as I teased the tip out and divided the fatty tissue that was densely adherent to it I did state away from the ureter and when it was mobilized out of felt like the ureter  was not injured.  The detached appendix was then placed in a bag and brought to the umbilicus.  The umbilical defect was repaired with a figure-of-eight suture of 0 Vicryl.  I surveyed there was no evidence of bleeding in the place where the appendix had been removed.  The patient tolerated the procedure well the Inj port sites were injected with 20 cc of Exparel and were closed with 4-0 Monocryl.  The patient tolerated the procedure well and was taken to the PACU in stable condition.     [name] Done, [place], [facility] Surgery, [place]

## 2022-03-16 NOTE — Progress Notes (Addendum)
Patient informed OR is delayed. Patient voiced understanding. Wife was also called and informed of delay, voicemail was left as she did not answer the phone.

## 2022-03-16 NOTE — Interval H&P Note (Signed)
History and Physical Interval Note:  03/16/2022 12:21 PM  James Mckinney  has presented today for surgery, with the diagnosis of [place].  The various methods of treatment have been discussed with the patient and family. After consideration of risks, benefits and other options for treatment, the patient has consented to  Procedure(s) with comments: APPENDECTOMY LAPAROSCOPIC (N/A) - 90 MIN - ROOM 10 as a surgical intervention.  The patient's history has been reviewed, patient examined, no change in status, stable for surgery.  I have reviewed the patient's chart and labs.  Questions were answered to the patient's satisfaction.     Pedro Earls

## 2022-03-16 NOTE — Transfer of Care (Signed)
Immediate Anesthesia Transfer of Care Note  Patient: James Mckinney  Procedure(s) Performed: APPENDECTOMY LAPAROSCOPIC  Patient Location: PACU  Anesthesia Type:General  Level of Consciousness: awake, alert  and oriented  Airway & Oxygen Therapy: Patient Spontanous Breathing and Patient connected to face mask oxygen  Post-op Assessment: Report given to RN and Post -op Vital signs reviewed and stable  Post vital signs: Reviewed and stable  Last Vitals:  Vitals Value Taken Time  BP 154/97 03/16/22 1723  Temp    Pulse 82 03/16/22 1726  Resp 14 03/16/22 1726  SpO2 100 % 03/16/22 1726  Vitals shown include unvalidated device data.  Last Pain:  Vitals:   03/16/22 1215  TempSrc:   PainSc: 0-No pain         Complications: No notable events documented.

## 2022-03-16 NOTE — Anesthesia Procedure Notes (Signed)
Procedure Name: Intubation Date/Time: 03/16/2022 3:31 PM  Performed by: Lollie Sails, CRNAPre-anesthesia Checklist: Patient identified, Emergency Drugs available, Suction available, Patient being monitored and Timeout performed Patient Re-evaluated:Patient Re-evaluated prior to induction Oxygen Delivery Method: Circle system utilized Preoxygenation: Pre-oxygenation with 100% oxygen Induction Type: IV induction Ventilation: Mask ventilation without difficulty Laryngoscope Size: Miller and 3 Grade View: Grade II Tube type: Oral Number of attempts: 1 Airway Equipment and Method: Stylet Placement Confirmation: ETT inserted through vocal cords under direct vision, positive ETCO2 and breath sounds checked- equal and bilateral Secured at: 23 cm Tube secured with: Tape Dental Injury: Teeth and Oropharynx as per pre-operative assessment

## 2022-03-16 NOTE — Anesthesia Preprocedure Evaluation (Signed)
Anesthesia Evaluation  Patient identified by MRN, date of birth, ID band Patient awake    Reviewed: Allergy & Precautions, NPO status , Patient's Chart, lab work & pertinent test results  Airway Mallampati: II  TM Distance: >3 FB Neck ROM: Full    Dental no notable dental hx.    Pulmonary neg pulmonary ROS,    Pulmonary exam normal breath sounds clear to auscultation       Cardiovascular hypertension, Pt. on medications negative cardio ROS Normal cardiovascular exam Rhythm:Regular Rate:Normal     Neuro/Psych  Headaches, Anxiety Depression CVA negative psych ROS   GI/Hepatic Neg liver ROS, GERD  ,  Endo/Other  negative endocrine ROS  Renal/GU negative Renal ROS  negative genitourinary   Musculoskeletal  (+) Arthritis , Osteoarthritis,    Abdominal (+) + obese,   Peds negative pediatric ROS (+)  Hematology negative hematology ROS (+)   Anesthesia Other Findings   Reproductive/Obstetrics negative OB ROS                             Anesthesia Physical Anesthesia Plan  ASA: 3  Anesthesia Plan: General   Post-op Pain Management: Dilaudid IV   Induction: Intravenous  PONV Risk Score and Plan: 2 and Ondansetron, Midazolam and Treatment may vary due to age or medical condition  Airway Management Planned: Oral ETT  Additional Equipment:   Intra-op Plan:   Post-operative Plan: Extubation in OR  Informed Consent: I have reviewed the patients History and Physical, chart, labs and discussed the procedure including the risks, benefits and alternatives for the proposed anesthesia with the patient or authorized representative who has indicated his/her understanding and acceptance.     Dental advisory given  Plan Discussed with: CRNA  Anesthesia Plan Comments:         Anesthesia Quick Evaluation

## 2022-03-16 NOTE — Anesthesia Postprocedure Evaluation (Signed)
Anesthesia Post Note  Patient: James Mckinney  Procedure(s) Performed: APPENDECTOMY LAPAROSCOPIC     Patient location during evaluation: PACU Anesthesia Type: General Level of consciousness: awake and alert Pain management: pain level controlled Vital Signs Assessment: post-procedure vital signs reviewed and stable Respiratory status: spontaneous breathing, nonlabored ventilation and respiratory function stable Cardiovascular status: blood pressure returned to baseline and stable Postop Assessment: no apparent nausea or vomiting Anesthetic complications: no   No notable events documented.  Last Vitals:  Vitals:   03/16/22 1853 03/16/22 1956  BP: (!) 161/90 (!) 166/85  Pulse: 90 95  Resp: 16 18  Temp: 36.7 C (!) 36.3 C  SpO2: 96% 99%    Last Pain:  Vitals:   03/16/22 2006  TempSrc:   PainSc: [place]

## 2022-03-17 ENCOUNTER — Encounter (HOSPITAL_COMMUNITY): Payer: Self-pay | Admitting: Surgery

## 2022-03-17 DIAGNOSIS — Z7982 Long term (current) use of aspirin: Secondary | ICD-10-CM | POA: Diagnosis not present

## 2022-03-17 DIAGNOSIS — C181 Malignant neoplasm of appendix: Secondary | ICD-10-CM | POA: Diagnosis not present

## 2022-03-17 DIAGNOSIS — Z8546 Personal history of malignant neoplasm of prostate: Secondary | ICD-10-CM | POA: Diagnosis not present

## 2022-03-17 DIAGNOSIS — Z79899 Other long term (current) drug therapy: Secondary | ICD-10-CM | POA: Diagnosis not present

## 2022-03-17 DIAGNOSIS — I10 Essential (primary) hypertension: Secondary | ICD-10-CM | POA: Diagnosis not present

## 2022-03-17 LAB — COMPREHENSIVE METABOLIC PANEL
ALT: 20 U/L (ref 0–44)
AST: 22 U/L (ref 15–41)
Albumin: 3.2 g/dL — ABNORMAL LOW (ref 3.5–5.0)
Alkaline Phosphatase: 53 U/L (ref 38–126)
Anion gap: 8 (ref 5–15)
BUN: 14 mg/dL (ref 8–23)
CO2: 27 mmol/L (ref 22–32)
Calcium: 9.1 mg/dL (ref 8.9–10.3)
Chloride: 105 mmol/L (ref 98–111)
Creatinine, Ser: 1.26 mg/dL — ABNORMAL HIGH (ref 0.61–1.24)
GFR, Estimated: 59 mL/min — ABNORMAL LOW (ref >60)
Glucose, Bld: 116 mg/dL — ABNORMAL HIGH (ref 70–99)
Potassium: 3.9 mmol/L (ref 3.5–5.1)
Sodium: 140 mmol/L (ref 135–145)
Total Bilirubin: 1 mg/dL (ref 0.3–1.2)
Total Protein: 6.3 g/dL — ABNORMAL LOW (ref 6.5–8.1)

## 2022-03-17 LAB — CBC
HCT: 45.7 % (ref 39.0–52.0)
Hemoglobin: 14.5 g/dL (ref 13.0–17.0)
MCH: 27.9 pg (ref 26.0–34.0)
MCHC: 31.7 g/dL (ref 30.0–36.0)
MCV: 88.1 fL (ref 80.0–100.0)
Platelets: 165 10*3/uL (ref 150–400)
RBC: 5.19 MIL/uL (ref 4.22–5.81)
RDW: 13.9 % (ref 11.5–15.5)
WBC: 13.8 10*3/uL — ABNORMAL HIGH (ref 4.0–10.5)
nRBC: 0 % (ref 0.0–0.2)

## 2022-03-17 MED ORDER — ONDANSETRON 4 MG PO TBDP: 4.0000 mg | ORAL_TABLET | Freq: Four times a day (QID) | ORAL | 0 refills | Status: DC | PRN

## 2022-03-17 MED ORDER — HYDROCODONE-ACETAMINOPHEN 5-325 MG PO TABS: 1.0000 | ORAL_TABLET | ORAL | 0 refills | Status: DC | PRN

## 2022-03-17 NOTE — Discharge Summary (Signed)
Physician Discharge Summary  Patient ID: James Mckinney MRN: [id number] DOB/AGE: 1946-01-17 77 y.o.  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  Difficulty retrocecal appendix removed laparoscopically.  Pain control very good post op and ready for discharge in the AM Wednesday.    Consults: none  Significant Diagnostic Studies: Lab including CBC and CMET ok    Discharge Exam: Blood pressure (!) 111/55, pulse 70, temperature 98.8 F (37.1 C), temperature source Oral, resp. rate 18, height 6' (1.829 m), weight 114 kg, SpO2 94 %. Incisions are covered with Dermabond  Disposition: Discharge disposition: 01-Home or Self Care       Discharge Instructions     Call MD for:  redness, tenderness, or signs of infection (pain, swelling, redness, odor or green/yellow discharge around incision site)   Complete by: As directed    Diet - low sodium heart healthy   Complete by: As directed    Discharge instructions   Complete by: As directed    You may shower when you get home.   Increase activity slowly   Complete by: As directed       Allergies as of 03/17/2022       Reactions   Zestril [lisinopril] Cough        Medication List     TAKE these medications    acetaminophen 500 MG tablet Commonly known as: TYLENOL Take 1,000 mg by mouth every 6 (six) hours as needed for mild pain.   aspirin 325 MG tablet Take 325 mg by mouth daily.   brimonidine 0.2 % ophthalmic solution Commonly known as: ALPHAGAN Place 1 drop into both eyes 2 (two) times daily.   diphenhydramine-acetaminophen 25-500 MG Tabs tablet Commonly known as: TYLENOL PM Take  1 tablet by mouth at bedtime as needed (Sleep).   dorzolamide 2 % ophthalmic solution Commonly known as: TRUSOPT Place 1 drop into both eyes 2 (two) times daily.   HYDROcodone-acetaminophen 5-325 MG tablet Commonly known as: NORCO/VICODIN Take 1-2 tablets by mouth every 4 (four) hours as needed for moderate pain.   latanoprost 0.005 % ophthalmic solution Commonly known as: XALATAN Place 1 drop into both eyes at bedtime.   lisinopril 20 MG tablet Commonly known as: ZESTRIL Take 20 mg by mouth daily.   ondansetron 4 MG disintegrating tablet Commonly known as: ZOFRAN-ODT Take 1 tablet (4 mg total) by mouth every 6 (six) hours as needed for nausea.   One-A-Day Mens 50+ Tabs Take 1 tablet by mouth daily.   rosuvastatin 10 MG tablet Commonly known as: CRESTOR Take 10 mg by mouth daily.   VITAMIN D PO Take 2,000 Units by mouth daily.        Follow-up Information     Johnathan Hausen, MD. Schedule an appointment as soon as possible for a visit in 2 week(s).   Specialty: General Surgery Why: For routine follow up with Dr. Carter Kitten information: [place] [phone]                 Signed: Pedro Earls 03/17/2022, 8:45 AM

## 2022-03-17 NOTE — Progress Notes (Signed)
Discharge instructions discussed with patient, verbalized agreement and understanding 

## 2022-03-18 ENCOUNTER — Other Ambulatory Visit: Payer: Self-pay | Admitting: Urology

## 2022-03-18 NOTE — Progress Notes (Signed)
RN spoke with patient since his laparoscopic appendectomy on 9/19.   Patient reports feeling well, is at home recovering.   RN educated patient on next steps post surgery.  Pt understands that radiation will be started in a few months post surgery and we will work on coordinating fiducial's, [place], and CT Simulation.   RN reinforced education on ADT coverage will allow [place] safely to start radiation at a later date due to surgery and allowing time for body to heal.  Verbalized understanding and agreement.  Pt has follow up with Dr. Hassell Done on 10/4.   No further needs at this time, will notify MD's and staff to assist with coordinating care.  RN will continue follow.

## 2022-03-19 ENCOUNTER — Telehealth: Payer: Self-pay | Admitting: *Deleted

## 2022-03-19 NOTE — Telephone Encounter (Signed)
CALLED PATIENT TO INFORM OF FID. MARKER AND SPACE OAR PLACEMENT ON 05-25-22 AND HIS SIM APPT. ON 05-27-22- ARRIVAL TIME- 7:45 AM @ [facility], INFORMED PATIENT TO ARRIVE WITH A FULL BLADDER, LVM FOR A RETURN CALL

## 2022-03-26 ENCOUNTER — Other Ambulatory Visit: Payer: Self-pay

## 2022-03-26 ENCOUNTER — Encounter (HOSPITAL_COMMUNITY): Payer: Self-pay | Admitting: Surgery

## 2022-03-26 NOTE — Progress Notes (Signed)
For Short Stay: COVID SWAB appointment date: N/A Date of COVID positive in last 21 days:N/A  Bowel Prep reminder:N/A   For Anesthesia: PCP - Haywood Pao, MD Cardiologist - unsure  Chest x-ray - N/A EKG - 03/12/22 in epic Stress Test - greater than 2 years ECHO - 01/05/21 in epic Cardiac Cath - N/A Pacemaker/ICD device last checked:N/A Pacemaker orders received:N/A Device Rep notified:N/A  Spinal Cord Stimulator: N/A  Sleep Study - Yes CPAP - Yes  Fasting Blood Sugar - N/A Checks Blood Sugar __N/A___ times a day Date and result of last Hgb A1c-N/A  Blood Thinner Instructions: N/A Aspirin Instructions: Last Dose:  Activity level: Can go up a flight of stairs and activities of daily living without stopping and without chest pain and/or shortness of breath         Anesthesia review: HTN, History of strokes   Patient denies shortness of breath, fever, cough and chest pain at PAT appointment   Patient verbalized understanding of instructions that were given to them at the PAT appointment. Patient was also instructed that they will need to review over the PAT instructions again at home before surgery.

## 2022-03-30 ENCOUNTER — Encounter: Payer: Self-pay | Admitting: Genetic Counselor

## 2022-03-30 ENCOUNTER — Ambulatory Visit: Payer: Self-pay | Admitting: Genetic Counselor

## 2022-03-30 ENCOUNTER — Telehealth: Payer: Self-pay | Admitting: Genetic Counselor

## 2022-03-30 DIAGNOSIS — Z1379 Encounter for other screening for genetic and chromosomal anomalies: Secondary | ICD-10-CM

## 2022-03-30 DIAGNOSIS — Z8042 Family history of malignant neoplasm of prostate: Secondary | ICD-10-CM

## 2022-03-30 DIAGNOSIS — Z803 Family history of malignant neoplasm of breast: Secondary | ICD-10-CM

## 2022-03-30 DIAGNOSIS — C61 Malignant neoplasm of prostate: Secondary | ICD-10-CM

## 2022-03-30 NOTE — Progress Notes (Addendum)
HPI:   James Mckinney was previously seen in the [facility] due to a personal and family history of cancer and concerns regarding a hereditary predisposition to cancer. Please refer to our prior cancer genetics clinic note for more information regarding our discussion, assessment and recommendations, at the time. James Mckinney recent genetic test results were disclosed to him, as were recommendations warranted by these results. These results and recommendations are discussed in more detail below.  CANCER HISTORY:  In 2018, at the age of 76, James Mckinney was diagnosed with adenocarcinoma of the prostate (Gleason 3+3=6). In August 2023, at the age of 76, biopsy indicated Gleason 4+4=8 present, indicating upgraded disease.   Oncology History  Malignant neoplasm of prostate (HCC)  02/05/2022 Cancer Staging   Staging form: Prostate, AJCC 8th Edition - Clinical stage from 02/05/2022: Stage IIC (cT2a, cN0, cM0, PSA: 18, Grade Group: 4) - Signed by Marcello Fennel, PA-C on 02/16/2022 Histopathologic type: Adenocarcinoma, NOS Stage prefix: Initial diagnosis Prostate specific antigen (PSA) range: 10 to 19 Gleason primary pattern: 4 Gleason secondary pattern: 4 Gleason score: 8 Histologic grading system: 5 grade system Number of biopsy cores examined: 28 Number of biopsy cores positive: 6 Location of positive needle core biopsies: Both sides   02/16/2022 Initial Diagnosis   Malignant neoplasm of prostate (HCC)   03/12/2022 Genetic Testing   Negative genetic testing.  VUS detected in MSH3 at  p.Q1024H (c.3072G>C) and MSH6 at p.V215I (c.643G>A).  Report date is March 12, 2022.   The CancerNext-Expanded gene panel offered by [facility] and includes sequencing, rearrangement, and RNA analysis for the following 77 genes: AIP, ALK, APC, ATM, AXIN2, BAP1, BARD1, BLM, BMPR1A, BRCA1, BRCA2, BRIP1, CDC73, CDH1, CDK4, CDKN1B, CDKN2A, CHEK2, CTNNA1, DICER1, FANCC, FH, FLCN, GALNT12, KIF1B,  LZTR1, MAX, MEN1, MET, MLH1, MSH2, MSH3, MSH6, MUTYH, NBN, NF1, NF2, NTHL1, PALB2, PHOX2B, PMS2, POT1, PRKAR1A, PTCH1, PTEN, RAD51C, RAD51D, RB1, RECQL, RET, SDHA, SDHAF2, SDHB, SDHC, SDHD, SMAD4, SMARCA4, SMARCB1, SMARCE1, STK11, SUFU, TMEM127, TP53, TSC1, TSC2, VHL and XRCC2 (sequencing and deletion/duplication); EGFR, EGLN1, HOXB13, KIT, MITF, PDGFRA, POLD1, and POLE (sequencing only); EPCAM and GREM1 (deletion/duplication only).      FAMILY HISTORY:  We obtained a detailed, 4-generation family history.  Significant diagnoses are listed below:      Family History  Problem Relation Age of Onset   Cancer Mother 101        stomach or other primary?   Prostate cancer Father          dx before 68   Lung cancer Father          smoking hx   Multiple myeloma Sister     Breast cancer Sister          dx 25s   Cancer Brother          nasal/sinus?       James Mckinney is unaware of previous family history of genetic testing for hereditary cancer risks. There is no reported Ashkenazi Jewish ancestry. There is no known consanguinity.  GENETIC TEST RESULTS:  The [facility] CancerNext-Expanded +RNAinsight Panel found no pathogenic mutations. The CancerNext-Expanded gene panel offered by [facility] and includes sequencing, rearrangement, and RNA analysis for the following 77 genes: AIP, ALK, APC, ATM, AXIN2, BAP1, BARD1, BLM, BMPR1A, BRCA1, BRCA2, BRIP1, CDC73, CDH1, CDK4, CDKN1B, CDKN2A, CHEK2, CTNNA1, DICER1, FANCC, FH, FLCN, GALNT12, KIF1B, LZTR1, MAX, MEN1, MET, MLH1, MSH2, MSH3, MSH6, MUTYH, NBN, NF1, NF2, NTHL1, PALB2, PHOX2B, PMS2, POT1, PRKAR1A, PTCH1,  PTEN, RAD51C, RAD51D, RB1, RECQL, RET, SDHA, SDHAF2, SDHB, SDHC, SDHD, SMAD4, SMARCA4, SMARCB1, SMARCE1, STK11, SUFU, TMEM127, TP53, TSC1, TSC2, VHL and XRCC2 (sequencing and deletion/duplication); EGFR, EGLN1, HOXB13, KIT, MITF, PDGFRA, POLD1, and POLE (sequencing only); EPCAM and GREM1 (deletion/duplication only).    The test report has been scanned  into EPIC and is located under the Molecular Pathology section of the Results Review tab.  A portion of the result report is included below for reference. Genetic testing reported out on March 12, 2022.     Genetic testing did identify two variants of uncertain significance (VUS) - one in the MSH6 gene called p.V215I (c.643G>A) and a second in the [facility] gene called  p.Q1024H (c.3072G>C). At this time, it is unknown if these variants are associated with increased cancer risk or if they are normal findings, but most variants such as these get reclassified to being inconsequential. They should not be used to make medical management decisions. With time, we suspect the lab will determine the significance of these variants, if any. If we do learn more about them, we will try to contact James Mckinney to discuss it further. However, it is important to stay in touch with [place] periodically and keep the address and phone number up to date.  Update: The VUS in MSH3 at  p.Q1024H (c.3072G>C) and MSH6 at p.V215I (c.643G>A) have both been reclassified to likely benign.  The amended report dates are 01/18/2023 and 07/01/2022, respectively.   Even though a pathogenic variant was not identified, possible explanations for the cancer in the family may include: There may be no hereditary risk for cancer in the family. The cancers in James Mckinney and/or his family may be sporadic/familial or due to other genetic and environmental factors. There may be a gene mutation in one of these genes that current testing methods cannot detect but that chance is small. There could be another gene that has not yet been discovered, or that we have not yet tested, that is responsible for the cancer diagnoses in the family.  It is also possible there is a hereditary cause for the cancer in the family that James Mckinney did not inherit.   Therefore, it is important to remain in touch with cancer genetics in the future so that we can continue to offer  James Mckinney the most up to date genetic testing.    ADDITIONAL GENETIC TESTING:  We discussed with James Mckinney that his genetic testing was fairly extensive.  If there are additional relevant genes identified to increase cancer risk that can be analyzed in the future, we would be happy to discuss and coordinate this testing at that time.     CANCER SCREENING RECOMMENDATIONS:  James Mckinney test result is considered negative (normal).  This means that we have not identified a hereditary cause for his personal history of prostate cancer at this time.   An individual's cancer risk and medical management are not determined by genetic test results alone. Overall cancer risk assessment incorporates additional factors, including personal medical history, family history, and any available genetic information that may result in a personalized plan for cancer prevention and surveillance. Therefore, it is recommended he continue to follow the cancer management and screening guidelines provided by his oncology and primary healthcare provider.  RECOMMENDATIONS FOR FAMILY MEMBERS:   Since he did not inherit a identifiable mutation in a cancer predisposition gene included on this panel, his children could not have inherited a known mutation from him in one of  these genes. Individuals in this family might be at some increased risk of developing cancer, over the general population risk, due to the family history of cancer.  Individuals in the family should notify their providers of the family history of cancer. We recommend women in this family have a yearly mammogram beginning at age 43, or 48 years younger than the earliest onset of cancer, an annual clinical breast exam, and perform monthly breast self-exams.  Men in the family should speak with their providers about prostate cancer screening.  Other members of the family may still carry a pathogenic variant in one of these genes that James Mckinney did not inherit. Based  on the family history, we recommend his sister, who was diagnosed with breast cancer in her 68s, have genetic counseling and testing. James Mckinney can let us know if we can be of any assistance in coordinating genetic counseling and/or testing for this family member.   We do not recommend familial testing for the [facility] or MSH6 variant of uncertain significance (VUS).  FOLLOW-UP:  Lastly, we discussed with James Mckinney that cancer genetics is a rapidly advancing field and it is possible that new genetic tests will be appropriate for him and/or his family members in the future. We encouraged him to remain in contact with cancer genetics on an annual basis so we can update his personal and family histories and let him know of advances in cancer genetics that may benefit this family.   Our contact number was provided. James Mckinney questions were answered to his satisfaction, and he knows he is welcome to call us at anytime with additional questions or concerns.   James Lovett M. Rennie Plowman, MS, [facility] Genetic Counselor [email or web address] (P) [phone]

## 2022-03-30 NOTE — Telephone Encounter (Signed)
Revealed negative genetics with VUS in MSH3 and MSH6.

## 2022-04-01 ENCOUNTER — Encounter: Payer: Self-pay | Admitting: *Deleted

## 2022-04-01 ENCOUNTER — Ambulatory Visit: Payer: Self-pay | Admitting: Surgery

## 2022-04-01 DIAGNOSIS — C181 Malignant neoplasm of appendix: Secondary | ICD-10-CM

## 2022-04-01 DIAGNOSIS — C61 Malignant neoplasm of prostate: Secondary | ICD-10-CM

## 2022-04-01 DIAGNOSIS — R935 Abnormal findings on diagnostic imaging of other abdominal regions, including retroperitoneum: Secondary | ICD-10-CM

## 2022-04-01 NOTE — H&P (Signed)
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 male I wonder when I laparoscopic appendectomy last month for a suspicious mucocele finding on a CT scan. He's waiting to undergo radiation of his prostate cancer.  Appendectomy is perform with excision of the cecum base so at the time, but this revealed that there was in fact, goblet cell adenocarcinoma. The patient was advised to come back in for right colectomy to assess the status of his lymph nodes. Informed consent was obtained regarding laparoscopic  assisted right hemicolectomy.  Past Medical History:  Diagnosis Date   Adenocarcinoma, appendix (HCC)    Anemia    Anxiety    Arthritis    Depression    Family history of breast cancer 02/22/2022   Family history of prostate cancer 02/22/2022   GERD (gastroesophageal reflux disease)    Glaucoma    Headache    Hypertension    Kidney stone    Nephrolithiasis 11/2015   Pneumonia    PTSD (post-traumatic stress disorder)    Stroke (HCC)    mini per pt    Past Surgical History:  Procedure Laterality Date   COLONOSCOPY     LAPAROSCOPIC APPENDECTOMY N/A 03/16/2022   Procedure: APPENDECTOMY LAPAROSCOPIC;  Surgeon: Luretha Murphy, MD;  Location: [facility];  Service: General;  Laterality: N/A;  90 MIN - ROOM 10   NO PAST SURGERIES     WISDOM TOOTH EXTRACTION      No current facility-administered medications for this encounter.   Current Outpatient Medications  Medication Sig Dispense Refill   acetaminophen (TYLENOL) 500 MG tablet Take 1,000 mg by mouth every 6 (six) hours as needed for mild pain.     aspirin EC 81 MG tablet Take 81 mg by mouth daily.     diphenhydramine-acetaminophen (TYLENOL PM) 25-500 MG TABS tablet Take 1 tablet by mouth at bedtime as needed (Sleep).     dorzolamide (TRUSOPT) 2 % ophthalmic solution Place 1 drop into both eyes 2 (two) times daily.   0    latanoprost (XALATAN) 0.005 % ophthalmic solution Place 1 drop into both eyes at bedtime.   0   lisinopril (ZESTRIL) 20 MG tablet Take 20 mg by mouth daily.     Multiple Vitamins-Minerals (ONE-A-DAY MENS 50+) TABS Take 1 tablet by mouth daily.     rosuvastatin (CRESTOR) 10 MG tablet Take 10 mg by mouth daily.     VITAMIN D PO Take 2,000 Units by mouth daily.     HYDROcodone-acetaminophen (NORCO/VICODIN) 5-325 MG tablet Take 1-2 tablets by mouth every 4 (four) hours as needed for moderate pain. (Patient not taking: Reported on 03/29/2022) 30 tablet 0   ondansetron (ZOFRAN-ODT) 4 MG disintegrating tablet Take 1 tablet (4 mg total) by mouth every 6 (six) hours as needed for nausea. (Patient not taking: Reported on 03/29/2022) 20 tablet 0   Zestril [lisinopril] Family History  Problem Relation Age of Onset   Cancer Mother 38       stomach or other primary?   Diabetes Father    Prostate cancer Father        dx before 51   Lung cancer Father        smoking hx   Multiple myeloma Sister    Breast cancer Sister        dx 4s   Cancer Brother        nasal/sinus?  Social History:   reports that he has never smoked. He has never used smokeless tobacco. He reports that he does not drink alcohol and does not use drugs.   REVIEW OF SYSTEMS : Negative except for See problem list  Physical Exam:   Height 6' (1.829 m), weight 114.3 kg. Body mass index is 34.18 kg/m.  Gen:  WDWN African-American male NAD  Neurological: Alert and oriented to person, place, and time. Motor and sensory function is grossly intact  Head: Normocephalic and atraumatic.  Eyes: Conjunctivae are normal. Pupils are equal, round, and reactive to light. No scleral icterus.  Neck: Normal range of motion. Neck supple. No tracheal deviation or thyromegaly present.  Cardiovascular:  SR without murmurs or gallops.  No carotid bruits Breast:  Not examined Respiratory: Effort normal.  No respiratory distress. No chest wall  tenderness. Breath sounds normal.  No wheezes, rales or rhonchi.  Abdomen:   nice healing scars times three from his previous laparoscopic appendectomy GU:  Not performed Musculoskeletal: Normal range of motion. Extremities are nontender. No cyanosis, edema or clubbing noted Lymphadenopathy: No cervical, preauricular, postauricular or axillary adenopathy is present Skin: Skin is warm and dry. No rash noted. No diaphoresis. No erythema. No pallor. Pscyh: Normal mood and affect. Behavior is normal. Judgment and thought content normal.   LABORATORY RESULTS: No results found for this or any previous visit (from the past 48 hour(s)).   RADIOLOGY RESULTS: No results found.  Problem List: Patient Active Problem List   Diagnosis Date Noted   Primary cancer of appendix ([place]) 04/01/2022   Genetic testing 03/30/2022   Chronic appendicitis 03/16/2022   Family history of prostate cancer 02/22/2022   Family history of breast cancer 02/22/2022   Malignant neoplasm of prostate ([place]) 02/16/2022   Nephrolithiasis 12/03/2015   Hydronephrosis 12/03/2015   Ureteral stone with hydronephrosis 12/03/2015   Essential hypertension 12/03/2015   Glaucoma 12/03/2015   AKI (acute kidney injury) ([place]) 12/03/2015   Leukocytosis 12/03/2015   Constipation 12/03/2015    Assessment & Plan: Goblet cell carcinoma of the appendix, post appendectomy, now for right hemicolectomy.    [name] Done, MD, [facility] [phone] beeper [phone]  04/01/2022 1:10 PM

## 2022-04-01 NOTE — Progress Notes (Signed)
PATIENT NAVIGATOR PROGRESS NOTE  Name: James Mckinney Date: 04/01/2022 MRN: [id number]  DOB: 1946/05/30   Reason for visit:  Introductory phone call  Comments:  Called and spoke with [name] regarding New Patient appt with Dr Benay Spice for 10/25 at 1:10 pm. We discussed directions to building and parking.  Gave contact information to call with any questions or issues    Time spent counseling/coordinating care: 30-45 minutes

## 2022-04-02 ENCOUNTER — Inpatient Hospital Stay (HOSPITAL_COMMUNITY): Payer: Medicare HMO | Admitting: Physician Assistant

## 2022-04-02 ENCOUNTER — Other Ambulatory Visit: Payer: Self-pay

## 2022-04-02 ENCOUNTER — Inpatient Hospital Stay (HOSPITAL_COMMUNITY): Payer: Medicare HMO | Admitting: Certified Registered"

## 2022-04-02 ENCOUNTER — Inpatient Hospital Stay (HOSPITAL_COMMUNITY)
Admission: RE | Admit: 2022-04-02 | Discharge: 2022-04-05 | DRG: 331 | Disposition: A | Discharge status: Home / Self Care | Payer: Medicare HMO | Attending: Surgery | Admitting: Surgery

## 2022-04-02 ENCOUNTER — Encounter (HOSPITAL_COMMUNITY): Payer: Self-pay | Admitting: Surgery

## 2022-04-02 ENCOUNTER — Encounter (HOSPITAL_COMMUNITY)
Admission: RE | Disposition: A | Discharge status: Home / Self Care | Payer: Self-pay | Source: Home / Self Care | Attending: Surgery

## 2022-04-02 DIAGNOSIS — Z807 Family history of other malignant neoplasms of lymphoid, hematopoietic and related tissues: Secondary | ICD-10-CM | POA: Diagnosis not present

## 2022-04-02 DIAGNOSIS — K219 Gastro-esophageal reflux disease without esophagitis: Secondary | ICD-10-CM | POA: Diagnosis not present

## 2022-04-02 DIAGNOSIS — Z7982 Long term (current) use of aspirin: Secondary | ICD-10-CM

## 2022-04-02 DIAGNOSIS — Z87442 Personal history of urinary calculi: Secondary | ICD-10-CM | POA: Diagnosis not present

## 2022-04-02 DIAGNOSIS — H409 Unspecified glaucoma: Secondary | ICD-10-CM | POA: Diagnosis not present

## 2022-04-02 DIAGNOSIS — K660 Peritoneal adhesions (postprocedural) (postinfection): Secondary | ICD-10-CM | POA: Diagnosis not present

## 2022-04-02 DIAGNOSIS — D72829 Elevated white blood cell count, unspecified: Secondary | ICD-10-CM

## 2022-04-02 DIAGNOSIS — Z801 Family history of malignant neoplasm of trachea, bronchus and lung: Secondary | ICD-10-CM | POA: Diagnosis not present

## 2022-04-02 DIAGNOSIS — C181 Malignant neoplasm of appendix: Secondary | ICD-10-CM

## 2022-04-02 DIAGNOSIS — I10 Essential (primary) hypertension: Secondary | ICD-10-CM

## 2022-04-02 DIAGNOSIS — Z8673 Personal history of transient ischemic attack (TIA), and cerebral infarction without residual deficits: Secondary | ICD-10-CM | POA: Diagnosis not present

## 2022-04-02 DIAGNOSIS — Z888 Allergy status to other drugs, medicaments and biological substances status: Secondary | ICD-10-CM

## 2022-04-02 DIAGNOSIS — Z8042 Family history of malignant neoplasm of prostate: Secondary | ICD-10-CM

## 2022-04-02 DIAGNOSIS — Z833 Family history of diabetes mellitus: Secondary | ICD-10-CM | POA: Diagnosis not present

## 2022-04-02 DIAGNOSIS — Z79899 Other long term (current) drug therapy: Secondary | ICD-10-CM

## 2022-04-02 DIAGNOSIS — Z803 Family history of malignant neoplasm of breast: Secondary | ICD-10-CM | POA: Diagnosis not present

## 2022-04-02 DIAGNOSIS — C61 Malignant neoplasm of prostate: Secondary | ICD-10-CM | POA: Diagnosis not present

## 2022-04-02 HISTORY — PX: LAPAROSCOPIC PARTIAL RIGHT COLECTOMY: SHX5913

## 2022-04-02 HISTORY — DX: Malignant neoplasm of appendix: C18.1

## 2022-04-02 LAB — CBC
HCT: 47.4 % (ref 39.0–52.0)
Hemoglobin: 15.2 g/dL (ref 13.0–17.0)
MCH: 27.5 pg (ref 26.0–34.0)
MCHC: 32.1 g/dL (ref 30.0–36.0)
MCV: 85.9 fL (ref 80.0–100.0)
Platelets: 183 10*3/uL (ref 150–400)
RBC: 5.52 MIL/uL (ref 4.22–5.81)
RDW: 13.2 % (ref 11.5–15.5)
WBC: 6.9 10*3/uL (ref 4.0–10.5)
nRBC: 0 % (ref 0.0–0.2)

## 2022-04-02 LAB — BASIC METABOLIC PANEL
Anion gap: 7 (ref 5–15)
BUN: 13 mg/dL (ref 8–23)
CO2: 25 mmol/L (ref 22–32)
Calcium: 9 mg/dL (ref 8.9–10.3)
Chloride: 108 mmol/L (ref 98–111)
Creatinine, Ser: 1.19 mg/dL (ref 0.61–1.24)
GFR, Estimated: >60 mL/min (ref >60)
Glucose, Bld: 105 mg/dL — ABNORMAL HIGH (ref 70–99)
Sodium: 140 mmol/L (ref 135–145)

## 2022-04-02 LAB — HEMOGLOBIN A1C
Hgb A1c MFr Bld: 6.2 % — ABNORMAL HIGH (ref 4.8–5.6)
Mean Plasma Glucose: 131.24 mg/dL

## 2022-04-02 SURGERY — LAPAROSCOPIC PARTIAL RIGHT COLECTOMY
Anesthesia: General | Laterality: Right

## 2022-04-02 MED ORDER — MIDAZOLAM HCL 2 MG/2ML IJ SOLN
INTRAMUSCULAR | Status: AC
  Filled 2022-04-02: qty 2

## 2022-04-02 MED ORDER — ALVIMOPAN 12 MG PO CAPS
12.0000 mg | ORAL_CAPSULE | Freq: Two times a day (BID) | ORAL | Status: DC
  Administered 2022-04-03 – 2022-04-04 (×3): 12 mg via ORAL
  Filled 2022-04-02 (×3): qty 1

## 2022-04-02 MED ORDER — FENTANYL CITRATE (PF) 100 MCG/2ML IJ SOLN
INTRAMUSCULAR | Status: AC
  Filled 2022-04-02: qty 2

## 2022-04-02 MED ORDER — ENSURE PRE-SURGERY PO LIQD
592.0000 mL | Freq: Once | ORAL | Status: DC
  Filled 2022-04-02: qty 592

## 2022-04-02 MED ORDER — ONDANSETRON HCL 4 MG/2ML IJ SOLN
INTRAMUSCULAR | Status: DC | PRN
  Administered 2022-04-02: 4 mg via INTRAVENOUS

## 2022-04-02 MED ORDER — LABETALOL HCL 5 MG/ML IV SOLN
INTRAVENOUS | Status: DC | PRN
  Administered 2022-04-02 (×4): 2.5 mg via INTRAVENOUS
  Administered 2022-04-02: 5 mg via INTRAVENOUS
  Administered 2022-04-02 (×2): 2.5 mg via INTRAVENOUS

## 2022-04-02 MED ORDER — PROPOFOL 10 MG/ML IV BOLUS
INTRAVENOUS | Status: DC | PRN
  Administered 2022-04-02: 200 mg via INTRAVENOUS

## 2022-04-02 MED ORDER — POLYETHYLENE GLYCOL 3350 17 GM/SCOOP PO POWD
1.0000 | Freq: Once | ORAL | Status: DC
  Filled 2022-04-02: qty 255

## 2022-04-02 MED ORDER — KETAMINE HCL 10 MG/ML IJ SOLN
INTRAMUSCULAR | Status: AC
  Filled 2022-04-02: qty 1

## 2022-04-02 MED ORDER — NEOMYCIN SULFATE 500 MG PO TABS: 1000.0000 mg | ORAL_TABLET | ORAL | Status: DC

## 2022-04-02 MED ORDER — FENTANYL CITRATE PF 50 MCG/ML IJ SOSY
12.5000 ug | PREFILLED_SYRINGE | INTRAMUSCULAR | Status: DC | PRN
  Administered 2022-04-02: 12.5 ug via INTRAVENOUS
  Filled 2022-04-02: qty 1

## 2022-04-02 MED ORDER — ACETAMINOPHEN 500 MG PO TABS
1000.0000 mg | ORAL_TABLET | ORAL | Status: AC
  Administered 2022-04-02: 1000 mg via ORAL
  Filled 2022-04-02: qty 2

## 2022-04-02 MED ORDER — DEXAMETHASONE SODIUM PHOSPHATE 10 MG/ML IJ SOLN
INTRAMUSCULAR | Status: AC
  Filled 2022-04-02: qty 1

## 2022-04-02 MED ORDER — LIDOCAINE 2% (20 MG/ML) 5 ML SYRINGE
INTRAMUSCULAR | Status: DC | PRN
  Administered 2022-04-02: 60 mg via INTRAVENOUS

## 2022-04-02 MED ORDER — ONDANSETRON HCL 4 MG/2ML IJ SOLN
INTRAMUSCULAR | Status: AC
  Filled 2022-04-02: qty 2

## 2022-04-02 MED ORDER — SODIUM CHLORIDE (PF) 0.9 % IJ SOLN
INTRAMUSCULAR | Status: AC
  Filled 2022-04-02: qty 10

## 2022-04-02 MED ORDER — LATANOPROST 0.005 % OP SOLN
1.0000 [drp] | Freq: Every day | OPHTHALMIC | Status: DC
  Administered 2022-04-02 – 2022-04-04 (×3): 1 [drp] via OPHTHALMIC
  Filled 2022-04-02: qty 2.5

## 2022-04-02 MED ORDER — PROPOFOL 10 MG/ML IV BOLUS
INTRAVENOUS | Status: AC
  Filled 2022-04-02: qty 20

## 2022-04-02 MED ORDER — LACTATED RINGERS IV SOLN: INTRAVENOUS | Status: DC

## 2022-04-02 MED ORDER — SODIUM CHLORIDE (PF) 0.9 % IJ SOLN
INTRAMUSCULAR | Status: AC
  Filled 2022-04-02: qty 50

## 2022-04-02 MED ORDER — SODIUM CHLORIDE 0.9 % IV SOLN
2.0000 g | INTRAVENOUS | Status: AC
  Administered 2022-04-02: 2 g via INTRAVENOUS
  Filled 2022-04-02: qty 2

## 2022-04-02 MED ORDER — FENTANYL CITRATE PF 50 MCG/ML IJ SOSY
PREFILLED_SYRINGE | INTRAMUSCULAR | Status: AC
  Filled 2022-04-02: qty 3

## 2022-04-02 MED ORDER — CHLORHEXIDINE GLUCONATE 0.12 % MT SOLN
15.0000 mL | Freq: Once | OROMUCOSAL | Status: AC
  Administered 2022-04-02: 15 mL via OROMUCOSAL

## 2022-04-02 MED ORDER — ROCURONIUM BROMIDE 10 MG/ML (PF) SYRINGE
PREFILLED_SYRINGE | INTRAVENOUS | Status: DC | PRN
  Administered 2022-04-02: 20 mg via INTRAVENOUS
  Administered 2022-04-02 (×3): 10 mg via INTRAVENOUS
  Administered 2022-04-02: 60 mg via INTRAVENOUS
  Administered 2022-04-02: 20 mg via INTRAVENOUS

## 2022-04-02 MED ORDER — DEXAMETHASONE SODIUM PHOSPHATE 10 MG/ML IJ SOLN
INTRAMUSCULAR | Status: DC | PRN
  Administered 2022-04-02: 4 mg via INTRAVENOUS

## 2022-04-02 MED ORDER — HEPARIN SODIUM (PORCINE) 5000 UNIT/ML IJ SOLN
5000.0000 [IU] | Freq: Three times a day (TID) | INTRAMUSCULAR | Status: DC
  Administered 2022-04-02 – 2022-04-05 (×7): 5000 [IU] via SUBCUTANEOUS
  Filled 2022-04-02 (×7): qty 1

## 2022-04-02 MED ORDER — SODIUM CHLORIDE 0.9 % IV SOLN
2.0000 g | Freq: Two times a day (BID) | INTRAVENOUS | Status: AC
  Administered 2022-04-02: 2 g via INTRAVENOUS
  Filled 2022-04-02: qty 2

## 2022-04-02 MED ORDER — HYDROCODONE-ACETAMINOPHEN 5-325 MG PO TABS
1.0000 | ORAL_TABLET | ORAL | Status: DC | PRN
  Administered 2022-04-02 – 2022-04-03 (×4): 1 via ORAL
  Administered 2022-04-03 – 2022-04-04 (×2): 2 via ORAL
  Administered 2022-04-05: 1 via ORAL
  Filled 2022-04-02 (×2): qty 1
  Filled 2022-04-02: qty 2
  Filled 2022-04-02: qty 1
  Filled 2022-04-02: qty 2
  Filled 2022-04-02 (×2): qty 1

## 2022-04-02 MED ORDER — ONDANSETRON HCL 4 MG/2ML IJ SOLN: 4.0000 mg | Freq: Four times a day (QID) | INTRAMUSCULAR | Status: DC | PRN

## 2022-04-02 MED ORDER — ORAL CARE MOUTH RINSE: 15.0000 mL | Freq: Once | OROMUCOSAL | Status: AC

## 2022-04-02 MED ORDER — ENSURE PRE-SURGERY PO LIQD
296.0000 mL | Freq: Once | ORAL | Status: DC
  Filled 2022-04-02: qty 296

## 2022-04-02 MED ORDER — ALVIMOPAN 12 MG PO CAPS
12.0000 mg | ORAL_CAPSULE | ORAL | Status: AC
  Administered 2022-04-02: 12 mg via ORAL
  Filled 2022-04-02: qty 1

## 2022-04-02 MED ORDER — SUGAMMADEX SODIUM 200 MG/2ML IV SOLN
INTRAVENOUS | Status: DC | PRN
  Administered 2022-04-02: 250 mg via INTRAVENOUS

## 2022-04-02 MED ORDER — KETAMINE HCL 10 MG/ML IJ SOLN
INTRAMUSCULAR | Status: DC | PRN
  Administered 2022-04-02: 10 mg via INTRAVENOUS
  Administered 2022-04-02: 20 mg via INTRAVENOUS

## 2022-04-02 MED ORDER — ENSURE SURGERY PO LIQD
237.0000 mL | Freq: Two times a day (BID) | ORAL | Status: DC
  Administered 2022-04-03 – 2022-04-05 (×4): 237 mL via ORAL

## 2022-04-02 MED ORDER — ROCURONIUM BROMIDE 10 MG/ML (PF) SYRINGE
PREFILLED_SYRINGE | INTRAVENOUS | Status: AC
  Filled 2022-04-02: qty 10

## 2022-04-02 MED ORDER — SODIUM CHLORIDE (PF) 0.9 % IJ SOLN
INTRAMUSCULAR | Status: DC | PRN
  Administered 2022-04-02: 10 mL

## 2022-04-02 MED ORDER — LACTATED RINGERS IV SOLN: INTRAVENOUS | Status: DC | PRN

## 2022-04-02 MED ORDER — LISINOPRIL 20 MG PO TABS
20.0000 mg | ORAL_TABLET | Freq: Every day | ORAL | Status: DC
  Administered 2022-04-04 – 2022-04-05 (×2): 20 mg via ORAL
  Filled 2022-04-02 (×2): qty 1

## 2022-04-02 MED ORDER — DORZOLAMIDE HCL 2 % OP SOLN
1.0000 [drp] | Freq: Two times a day (BID) | OPHTHALMIC | Status: DC
  Administered 2022-04-02 – 2022-04-05 (×6): 1 [drp] via OPHTHALMIC
  Filled 2022-04-02: qty 10

## 2022-04-02 MED ORDER — MIDAZOLAM HCL 2 MG/2ML IJ SOLN
INTRAMUSCULAR | Status: DC | PRN
  Administered 2022-04-02: 2 mg via INTRAVENOUS

## 2022-04-02 MED ORDER — HYDROMORPHONE HCL 1 MG/ML IJ SOLN
INTRAMUSCULAR | Status: DC | PRN
  Administered 2022-04-02: .5 mg via INTRAVENOUS
  Administered 2022-04-02: .25 mg via INTRAVENOUS

## 2022-04-02 MED ORDER — 0.9 % SODIUM CHLORIDE (POUR BTL) OPTIME
TOPICAL | Status: DC | PRN
  Administered 2022-04-02: 1000 mL

## 2022-04-02 MED ORDER — FENTANYL CITRATE (PF) 250 MCG/5ML IJ SOLN
INTRAMUSCULAR | Status: DC | PRN
  Administered 2022-04-02: 100 ug via INTRAVENOUS
  Administered 2022-04-02 (×4): 50 ug via INTRAVENOUS

## 2022-04-02 MED ORDER — AMISULPRIDE (ANTIEMETIC) 5 MG/2ML IV SOLN: 10.0000 mg | Freq: Once | INTRAVENOUS | Status: DC | PRN

## 2022-04-02 MED ORDER — METOPROLOL TARTRATE 5 MG/5ML IV SOLN: 5.0000 mg | Freq: Four times a day (QID) | INTRAVENOUS | Status: DC | PRN

## 2022-04-02 MED ORDER — CHLORHEXIDINE GLUCONATE CLOTH 2 % EX PADS: 6.0000 | MEDICATED_PAD | Freq: Once | CUTANEOUS | Status: DC

## 2022-04-02 MED ORDER — ONDANSETRON HCL 4 MG PO TABS: 4.0000 mg | ORAL_TABLET | Freq: Four times a day (QID) | ORAL | Status: DC | PRN

## 2022-04-02 MED ORDER — LACTATED RINGERS IR SOLN
Status: DC | PRN
  Administered 2022-04-02: 1000 mL

## 2022-04-02 MED ORDER — BUPIVACAINE LIPOSOME 1.3 % IJ SUSP
INTRAMUSCULAR | Status: DC | PRN
  Administered 2022-04-02: 20 mL

## 2022-04-02 MED ORDER — HYDROMORPHONE HCL 2 MG/ML IJ SOLN
INTRAMUSCULAR | Status: AC
  Filled 2022-04-02: qty 1

## 2022-04-02 MED ORDER — METRONIDAZOLE 500 MG PO TABS: 1000.0000 mg | ORAL_TABLET | ORAL | Status: DC

## 2022-04-02 MED ORDER — KCL IN DEXTROSE-NACL 20-5-0.45 MEQ/L-%-% IV SOLN
INTRAVENOUS | Status: DC
  Filled 2022-04-02 (×3): qty 1000

## 2022-04-02 MED ORDER — LIDOCAINE HCL (PF) 2 % IJ SOLN
INTRAMUSCULAR | Status: AC
  Filled 2022-04-02: qty 10

## 2022-04-02 MED ORDER — HEPARIN SODIUM (PORCINE) 5000 UNIT/ML IJ SOLN
5000.0000 [IU] | Freq: Once | INTRAMUSCULAR | Status: AC
  Administered 2022-04-02: 5000 [IU] via SUBCUTANEOUS
  Filled 2022-04-02: qty 1

## 2022-04-02 MED ORDER — FENTANYL CITRATE PF 50 MCG/ML IJ SOSY
25.0000 ug | PREFILLED_SYRINGE | INTRAMUSCULAR | Status: DC | PRN
  Administered 2022-04-02 (×3): 50 ug via INTRAVENOUS

## 2022-04-02 MED ORDER — BUPIVACAINE LIPOSOME 1.3 % IJ SUSP: 20.0000 mL | Freq: Once | INTRAMUSCULAR | Status: DC

## 2022-04-02 SURGICAL SUPPLY — 80 items
ADH SKN CLS LQ APL DERMABOND (GAUZE/BANDAGES/DRESSINGS) ×1
APL PRP STRL LF DISP 70% ISPRP (MISCELLANEOUS) ×1
APPLIER CLIP 5 13 M/L LIGAMAX5 (MISCELLANEOUS)
APPLIER CLIP ROT 10 11.4 M/L (STAPLE)
APR CLP MED LRG 11.4X10 (STAPLE)
APR CLP MED LRG 5 ANG JAW (MISCELLANEOUS)
BAG COUNTER SPONGE SURGICOUNT (BAG) IMPLANT
BAG SPNG CNTER NS LX DISP (BAG)
BLADE EXTENDED COATED 6.5IN (ELECTRODE) IMPLANT
CABLE HIGH FREQUENCY MONO STRZ (ELECTRODE) ×1 IMPLANT
CELLS DAT CNTRL 66122 CELL SVR (MISCELLANEOUS) ×1 IMPLANT
CHLORAPREP W/TINT 26 (MISCELLANEOUS) ×1 IMPLANT
CLIP APPLIE 5 13 M/L LIGAMAX5 (MISCELLANEOUS) IMPLANT
CLIP APPLIE ROT 10 11.4 M/L (STAPLE) IMPLANT
COUNTER NEEDLE 20 DBL MAG RED (NEEDLE) ×1 IMPLANT
COVER MAYO STAND STRL (DRAPES) ×3 IMPLANT
COVER SURGICAL LIGHT HANDLE (MISCELLANEOUS) ×1 IMPLANT
DERMABOND ADVANCED .7 DNX6 (GAUZE/BANDAGES/DRESSINGS) IMPLANT
DRAPE LAPAROSCOPIC ABDOMINAL (DRAPES) ×1 IMPLANT
DRSG OPSITE POSTOP 4X10 (GAUZE/BANDAGES/DRESSINGS) IMPLANT
DRSG OPSITE POSTOP 4X6 (GAUZE/BANDAGES/DRESSINGS) IMPLANT
DRSG OPSITE POSTOP 4X8 (GAUZE/BANDAGES/DRESSINGS) IMPLANT
ELECT REM PT RETURN 15FT ADLT (MISCELLANEOUS) ×1 IMPLANT
GAUZE 4X4 16PLY ~~LOC~~+RFID DBL (SPONGE) IMPLANT
GAUZE SPONGE 4X4 12PLY STRL (GAUZE/BANDAGES/DRESSINGS) IMPLANT
GLOVE SURG LX STRL 8.0 MICRO (GLOVE) ×2 IMPLANT
GOWN STRL REUS W/ TWL XL LVL3 (GOWN DISPOSABLE) ×6 IMPLANT
GOWN STRL REUS W/TWL XL LVL3 (GOWN DISPOSABLE) ×4
HANDLE STAPLE EGIA 4 XL (STAPLE) IMPLANT
IRRIG SUCT STRYKERFLOW 2 WTIP (MISCELLANEOUS) ×1
IRRIGATION SUCT STRKRFLW 2 WTP (MISCELLANEOUS) ×1 IMPLANT
KIT TURNOVER KIT A (KITS) IMPLANT
LEGGING LITHOTOMY PAIR STRL (DRAPES) IMPLANT
LIGASURE IMPACT 36 18CM CVD LR (INSTRUMENTS) IMPLANT
PACK COLON (CUSTOM PROCEDURE TRAY) ×1 IMPLANT
PAD POSITIONING PINK XL (MISCELLANEOUS) ×1 IMPLANT
PENCIL SMOKE EVACUATOR (MISCELLANEOUS) IMPLANT
PROTECTOR NERVE ULNAR (MISCELLANEOUS) ×2 IMPLANT
RELOAD EGIA 60 MED/THCK PURPLE (STAPLE) ×1 IMPLANT
RELOAD PROXIMATE 75MM BLUE (ENDOMECHANICALS) IMPLANT
RELOAD STAPLE 60 MED/THCK ART (STAPLE) IMPLANT
RELOAD STAPLE 75 3.8 BLU REG (ENDOMECHANICALS) IMPLANT
RELOAD TRI 60 ART MED THCK PUR (STAPLE) IMPLANT
RETRACTOR WND ALEXIS 18 MED (MISCELLANEOUS) IMPLANT
RTRCTR WOUND ALEXIS 18CM MED (MISCELLANEOUS) ×1
SCISSORS LAP 5X45 EPIX DISP (ENDOMECHANICALS) ×1 IMPLANT
SET TUBE SMOKE EVAC HIGH FLOW (TUBING) ×1 IMPLANT
SLEEVE Z-THREAD 5X100MM (TROCAR) ×2 IMPLANT
SPIKE FLUID TRANSFER (MISCELLANEOUS) ×1 IMPLANT
STAPLER CIRCULAR MANUAL XL 29 (STAPLE) IMPLANT
STAPLER CVD CUT BL 40 RELOAD (ENDOMECHANICALS) IMPLANT
STAPLER CVD CUT BLU 40 RELOAD (ENDOMECHANICALS) IMPLANT
STAPLER ECHELON POWER CIR 29 (STAPLE) IMPLANT
STAPLER ECHELON POWER CIR 31 (STAPLE) IMPLANT
STAPLER GUN LINEAR PROX 60 (STAPLE) IMPLANT
STAPLER PROXIMATE 75MM BLUE (STAPLE) IMPLANT
STAPLER VISISTAT 35W (STAPLE) ×1 IMPLANT
SUT CHROMIC 3 0 SH 27 (SUTURE) IMPLANT
SUT MNCRL AB 4-0 PS2 18 (SUTURE) IMPLANT
SUT NOVA NAB DX-16 0-1 5-0 T12 (SUTURE) IMPLANT
SUT PDS AB 1 TP1 96 (SUTURE) IMPLANT
SUT PDS AB 4-0 SH 27 (SUTURE) IMPLANT
SUT PROLENE 2 0 KS (SUTURE) IMPLANT
SUT SILK 2 0 (SUTURE) ×1
SUT SILK 2 0 SH CR/8 (SUTURE) ×1 IMPLANT
SUT SILK 2-0 18XBRD TIE 12 (SUTURE) ×1 IMPLANT
SUT SILK 3 0 (SUTURE) ×1
SUT SILK 3 0 SH CR/8 (SUTURE) ×1 IMPLANT
SUT SILK 3-0 18XBRD TIE 12 (SUTURE) ×1 IMPLANT
SUT VIC AB 4-0 SH 18 (SUTURE) ×1 IMPLANT
SYS LAPSCP GELPORT 120MM (MISCELLANEOUS)
SYS WOUND ALEXIS 18CM MED (MISCELLANEOUS)
SYSTEM LAPSCP GELPORT 120MM (MISCELLANEOUS) IMPLANT
SYSTEM WOUND ALEXIS 18CM MED (MISCELLANEOUS) IMPLANT
TOWEL OR NON WOVEN STRL DISP B (DISPOSABLE) ×1 IMPLANT
TRAY FOLEY MTR SLVR 14FR STAT (SET/KITS/TRAYS/PACK) IMPLANT
TRAY FOLEY MTR SLVR 16FR STAT (SET/KITS/TRAYS/PACK) IMPLANT
TROCAR 11X100 Z THREAD (TROCAR) IMPLANT
TROCAR Z-THREAD OPTICAL 5X100M (TROCAR) ×1 IMPLANT
TUBING CONNECTING 10 (TUBING) IMPLANT

## 2022-04-02 NOTE — Anesthesia Procedure Notes (Signed)
Procedure Name: Intubation Date/Time: 04/02/2022 9:13 AM  Performed by: Eben Burow, CRNAPre-anesthesia Checklist: Patient identified, Emergency Drugs available, Suction available, Patient being monitored and Timeout performed Patient Re-evaluated:Patient Re-evaluated prior to induction Oxygen Delivery Method: Circle system utilized Preoxygenation: Pre-oxygenation with 100% oxygen Induction Type: IV induction Ventilation: Mask ventilation without difficulty Laryngoscope Size: Mac and 4 Grade View: Grade I Tube type: Oral Tube size: 7.5 mm Number of attempts: 1 Airway Equipment and Method: Stylet Placement Confirmation: ETT inserted through vocal cords under direct vision, positive ETCO2 and breath sounds checked- equal and bilateral Secured at: 23 cm Tube secured with: Tape Dental Injury: Teeth and Oropharynx as per pre-operative assessment

## 2022-04-02 NOTE — Progress Notes (Signed)
Martin MD notified of patient's history of prostate cancer and troubles urinating. MD verbal order to keep patient's foley catheter until tomorrow morning at 5 am.

## 2022-04-02 NOTE — Interval H&P Note (Signed)
History and Physical Interval Note:  04/02/2022 8:15 AM  James Mckinney  has presented today for surgery, with the diagnosis of ADENOCARCINOMA APPENDIX.  The various methods of treatment have been discussed with the patient and family. After consideration of risks, benefits and other options for treatment, the patient has consented to  Procedure(s): LAPAROSCOPIC ASSISTED RIGHT COLECTOMY (Right) as a surgical intervention.  The patient's history has been reviewed, patient examined, no change in status, stable for surgery.  I have reviewed the patient's chart and labs.  Questions were answered to the patient's satisfaction.     Pedro Earls

## 2022-04-02 NOTE — Interval H&P Note (Signed)
History and Physical Interval Note:  04/02/2022 8:16 AM  James Mckinney  has presented today for surgery, with the diagnosis of ADENOCARCINOMA APPENDIX.  The various methods of treatment have been discussed with the patient and family. After consideration of risks, benefits and other options for treatment, the patient has consented to  Procedure(s): LAPAROSCOPIC ASSISTED RIGHT COLECTOMY (Right) as a surgical intervention.  The patient's history has been reviewed, patient examined, no change in status, stable for surgery.  I have reviewed the patient's chart and labs.  Questions were answered to the patient's satisfaction.     Pedro Earls

## 2022-04-02 NOTE — Anesthesia Preprocedure Evaluation (Signed)
Anesthesia Evaluation  Patient identified by MRN, date of birth, ID band Patient awake    Reviewed: Allergy & Precautions, NPO status , Patient's Chart, lab work & pertinent test results  Airway Mallampati: III  TM Distance: >3 FB Neck ROM: Full    Dental  (+) Dental Advisory Given   Pulmonary neg pulmonary ROS,    breath sounds clear to auscultation       Cardiovascular hypertension, Pt. on medications  Rhythm:Regular Rate:Normal     Neuro/Psych CVA, No Residual Symptoms    GI/Hepatic Neg liver ROS, GERD  ,adenoCA of appendix   Endo/Other  negative endocrine ROS  Renal/GU Renal disease     Musculoskeletal  (+) Arthritis ,   Abdominal   Peds  Hematology negative hematology ROS (+)   Anesthesia Other Findings   Reproductive/Obstetrics                             Lab Results  Component Value Date   WBC 6.9 04/02/2022   HGB 15.2 04/02/2022   HCT 47.4 04/02/2022   MCV 85.9 04/02/2022   PLT 183 04/02/2022   Lab Results  Component Value Date   CREATININE 1.26 (H) 03/17/2022   BUN 14 03/17/2022   NA 140 03/17/2022   K 3.9 03/17/2022   CL 105 03/17/2022   CO2 27 03/17/2022    Anesthesia Physical Anesthesia Plan  ASA: 2  Anesthesia Plan: General   Post-op Pain Management: Tylenol PO (pre-op)*   Induction: Intravenous  PONV Risk Score and Plan: 3 and Dexamethasone, Ondansetron, Treatment may vary due to age or medical condition and Propofol infusion  Airway Management Planned: Oral ETT  Additional Equipment: None  Intra-op Plan:   Post-operative Plan: Extubation in OR  Informed Consent: I have reviewed the patients History and Physical, chart, labs and discussed the procedure including the risks, benefits and alternatives for the proposed anesthesia with the patient or authorized representative who has indicated his/her understanding and acceptance.     Dental  advisory given  Plan Discussed with: CRNA  Anesthesia Plan Comments:         Anesthesia Quick Evaluation

## 2022-04-02 NOTE — Transfer of Care (Signed)
Immediate Anesthesia Transfer of Care Note  Patient: James Mckinney  Procedure(s) Performed: LAPAROSCOPIC ASSISTED RIGHT COLECTOMY (Right)  Patient Location: PACU  Anesthesia Type:General  Level of Consciousness: awake, alert  and patient cooperative  Airway & Oxygen Therapy: Patient Spontanous Breathing and Patient connected to face mask oxygen  Post-op Assessment: Report given to RN and Post -op Vital signs reviewed and stable  Post vital signs: Reviewed and stable  Last Vitals:  Vitals Value Taken Time  BP 166/80 04/02/22 1300  Temp    Pulse 81 04/02/22 1301  Resp 13 04/02/22 1301  SpO2 99 % 04/02/22 1301  Vitals shown include unvalidated device data.  Last Pain:  Vitals:   04/02/22 0724  TempSrc:   PainSc: 0-No pain         Complications: No notable events documented.

## 2022-04-02 NOTE — Op Note (Signed)
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  After anesthesia was administered and the patient was prepped  with chloroprep  and a timeout was performed.  Access to the abdomen was achieved with a 5 mm through the left upper quadrant.  I used a midline port just above the umbilicus another in the left lower quadrant and eventually one in the right upper quadrant.  Using the harmonic scalpel I then mobilized the right colon beginning down where the previous appendectomy was done.  We did that and got that fairly mobile and then worked my way up to the right gutter, took down the hepatic flexure and moved across midline past the gallbladder free and then a fairly well.  I went back down in the in the right lower quadrant placing the patient back in Trendelenburg and worked on the terminal ileum which was stuck going down into the pelvis and turnaround we spent extra time getting that freed up and then mobilized mobilizing the cecum better.  We then made a midline incision wound slightly below the umbilicus it was about 10 cm in length.  Open the abdomen without difficulty using the 5 mm trocar there was a part of the incision.  Wound protector was then placed.  We then exteriorized the bowel.  I still I take a few more of the transverse colon adhesions down to once exteriorized we divided the distal ileum and used the Davidian 6 cm stapler with with TRS on both the division of the terminal ileum and the transverse colon.  We then went through the mesentery with the LigaSure.  We then did a side-to-side anastomosis tacking the bowel to  each other opening along the tenia and the antimesenteric border of the small bowel and inserting the 6 cm purple load without TRS creating the anastomosis.  The common defect was closed in 2 layers with 4-0 PDS and with interrupted 3 oh silks.  The mesentery was approximated with silks to close mesenteric defect.  This was then brought back into the abdomen.  We then changed gown and gloves and different closure equipment and redraped.  I did leave some trocars out so that I can look at the closure at the end.  With with clean gloves and clean gown and everything I then closed the abdomen with interrupted #1 Novafil's and those were tied we then insufflated the abdomen and it was a nice airtight closure.  I inspected the midline closure from within and it looked good and there was nothing adherent to it.  We then placed block in the incisions and the bulk of the material going just lateral to each incision to the incisions in the midline.  It was then irrigated and it was closed with staples.  The trocars were closed with 4-0 Monocryl.  The patient tolerated the procedure well and was taken to the PACU in stable condition.     James Mckinney Done, [place], [facility], [place]

## 2022-04-02 NOTE — Anesthesia Postprocedure Evaluation (Signed)
Anesthesia Post Note  Patient: James Mckinney  Procedure(s) Performed: LAPAROSCOPIC ASSISTED RIGHT COLECTOMY (Right)     Patient location during evaluation: PACU Anesthesia Type: General Level of consciousness: awake and alert Pain management: pain level controlled Vital Signs Assessment: post-procedure vital signs reviewed and stable Respiratory status: spontaneous breathing, nonlabored ventilation, respiratory function stable and patient connected to nasal cannula oxygen Cardiovascular status: blood pressure returned to baseline and stable Postop Assessment: no apparent nausea or vomiting Anesthetic complications: no   No notable events documented.  Last Vitals:  Vitals:   04/02/22 1400 04/02/22 1415  BP: (!) 150/80 (!) 156/77  Pulse: 77 68  Resp: 15 14  Temp: 36.7 C   SpO2: 97% 97%    Last Pain:  Vitals:   04/02/22 1400  TempSrc:   PainSc: [place]

## 2022-04-03 ENCOUNTER — Encounter (HOSPITAL_COMMUNITY): Payer: Self-pay | Admitting: Surgery

## 2022-04-03 LAB — BASIC METABOLIC PANEL
Anion gap: 6 (ref 5–15)
BUN: 12 mg/dL (ref 8–23)
CO2: 26 mmol/L (ref 22–32)
Calcium: 8.8 mg/dL — ABNORMAL LOW (ref 8.9–10.3)
Chloride: 107 mmol/L (ref 98–111)
Creatinine, Ser: 1.2 mg/dL (ref 0.61–1.24)
GFR, Estimated: >60 mL/min (ref >60)
Glucose, Bld: 154 mg/dL — ABNORMAL HIGH (ref 70–99)
Potassium: 3.9 mmol/L (ref 3.5–5.1)
Sodium: 139 mmol/L (ref 135–145)

## 2022-04-03 LAB — CBC
HCT: 42.4 % (ref 39.0–52.0)
Hemoglobin: 13.8 g/dL (ref 13.0–17.0)
MCH: 27.8 pg (ref 26.0–34.0)
MCHC: 32.5 g/dL (ref 30.0–36.0)
MCV: 85.3 fL (ref 80.0–100.0)
Platelets: 187 10*3/uL (ref 150–400)
RBC: 4.97 MIL/uL (ref 4.22–5.81)
RDW: 13.2 % (ref 11.5–15.5)
WBC: 17.5 10*3/uL — ABNORMAL HIGH (ref 4.0–10.5)
nRBC: 0 % (ref 0.0–0.2)

## 2022-04-03 MED ORDER — MELATONIN 3 MG PO TABS
3.0000 mg | ORAL_TABLET | Freq: Every day | ORAL | Status: DC
  Administered 2022-04-03 – 2022-04-04 (×2): 3 mg via ORAL
  Filled 2022-04-03 (×2): qty 1

## 2022-04-03 MED ORDER — SIMETHICONE 40 MG/0.6ML PO SUSP
40.0000 mg | Freq: Four times a day (QID) | ORAL | Status: DC | PRN
  Administered 2022-04-03 – 2022-04-04 (×2): 40 mg via ORAL
  Filled 2022-04-03 (×4): qty 0.6

## 2022-04-03 NOTE — Progress Notes (Signed)
    Assessment & Plan: POD#1 - status post lap right colectomy for appendiceal Ca - 10/6 - Dr. Hassell Done  Clear liquid diet  Encourage ambulation, OOB  Await path  Pain Rx        Armandina Gemma, MD [facility] [place] practice Office: [phone]        Chief Complaint: Appendiceal ca  Subjective: Patient in bed, family at bedside. Incisional pain.  Objective: Vital signs in last 24 hours: Temp:  [97.5 F (36.4 C)-98.7 F (37.1 C)] 98.7 F (37.1 C) (10/07 0603) Pulse Rate:  [64-92] 81 (10/07 0603) Resp:  [12-18] 18 (10/07 0603) BP: (141-178)/(73-94) 141/74 (10/07 0603) SpO2:  [94 %-99 %] 96 % (10/07 0603) Last BM Date : 04/01/22  Intake/Output from previous day: 10/06 0701 - 10/07 0700 In: 3163.1 [P.O.:480; I.V.:2583.1; IV Piggyback:100] Out: 1620 [Urine:1560; Blood:60] Intake/Output this shift: No intake/output data recorded.  Physical Exam: HEENT - sclerae clear, mucous membranes moist Neck - soft Chest - clear bilaterally Cor - RRR Abdomen - soft, obese; dressing and wounds dry and intact; active BS present Ext - no edema, non-tender Neuro - alert & oriented, no focal deficits  Lab Results:  Recent Labs    04/02/22 0741 04/03/22 0507  WBC 6.9 17.5*  HGB 15.2 13.8  HCT 47.4 42.4  PLT 183 187   BMET Recent Labs    04/02/22 0741 04/03/22 0507  NA 140 139  K MARKED HEMOLYSIS 3.9  CL 108 107  CO2 25 26  GLUCOSE 105* 154*  BUN 13 12  CREATININE 1.19 1.20  CALCIUM 9.0 8.8*   PT/INR No results for input(s): "LABPROT", "INR" in the last 72 hours. Comprehensive Metabolic Panel:    Component Value Date/Time   NA 139 04/03/2022 0507   NA 140 04/02/2022 0741   K 3.9 04/03/2022 0507   K MARKED HEMOLYSIS 04/02/2022 0741   CL 107 04/03/2022 0507   CL 108 04/02/2022 0741   CO2 26 04/03/2022 0507   CO2 25 04/02/2022 0741   BUN 12 04/03/2022 0507   BUN 13 04/02/2022 0741   CREATININE 1.20 04/03/2022 0507   CREATININE 1.19 04/02/2022  0741   GLUCOSE 154 (H) 04/03/2022 0507   GLUCOSE 105 (H) 04/02/2022 0741   CALCIUM 8.8 (L) 04/03/2022 0507   CALCIUM 9.0 04/02/2022 0741   AST 22 03/17/2022 0427   AST 21 11/27/2020 1651   ALT 20 03/17/2022 0427   ALT 18 11/27/2020 1651   ALKPHOS 53 03/17/2022 0427   ALKPHOS 57 11/27/2020 1651   BILITOT 1.0 03/17/2022 0427   BILITOT 0.7 11/27/2020 1651   PROT 6.3 (L) 03/17/2022 0427   PROT 7.2 11/27/2020 1651   ALBUMIN 3.2 (L) 03/17/2022 0427   ALBUMIN 3.7 11/27/2020 1651    Studies/Results: No results found.    Armandina Gemma 04/03/2022   Patient ID: James Mckinney, male   DOB: 04/08/1946, 76 y.o.   MRN: [id number]

## 2022-04-03 NOTE — Progress Notes (Signed)
Mobility Specialist - Progress Note   04/03/22 0956  Mobility  Activity Ambulated with assistance in hallway  Activity Response Tolerated well  Distance Ambulated (ft) 500 ft  $Mobility charge 1 Mobility  Level of Assistance Modified independent, requires aide device or extra time  Assistive Device Front wheel walker  HOB Elevated/Bed Position Self regulated  Range of Motion/Exercises Active   Pt was found in bed and agreeable to ambulate. Had no complaints and at EOS returned to sitting EOB with all necessities in reach.  Ferd Hibbs Mobility Specialist

## 2022-04-04 MED ORDER — ZOLPIDEM TARTRATE 5 MG PO TABS: 5.0000 mg | ORAL_TABLET | Freq: Every evening | ORAL | Status: DC | PRN

## 2022-04-04 NOTE — Progress Notes (Signed)
   04/04/22 1320  Assess: MEWS Score  Temp 98.5 F (36.9 C)  BP (!) 126/90  MAP (mmHg) 100  Pulse Rate (!) 119  Resp 16  Level of Consciousness Alert  SpO2 97 %  O2 Device Room Air  Assess: MEWS Score  MEWS Temp 0  MEWS Systolic 0  MEWS Pulse 2  MEWS RR 0  MEWS LOC 0  MEWS Score 2  MEWS Score Color Yellow  Assess: if the MEWS score is Yellow or Red  Were vital signs taken at a resting state? Yes  Focused Assessment No change from prior assessment  Does the patient meet 2 or more of the SIRS criteria? No  MEWS guidelines implemented *See Row Information* Yes  Take Vital Signs  Increase Vital Sign Frequency  Yellow: Q 2hr X 2 then Q 4hr X 2, if remains yellow, continue Q 4hrs  Escalate  MEWS: Escalate Yellow: discuss with charge nurse/RN and consider discussing with provider and RRT  Notify: Charge Nurse/RN  Name of Charge Nurse/RN Notified Rogue Pautler/Teresa  Date Charge Nurse/RN Notified 04/04/22  Time Charge Nurse/RN Notified 1340  Document  Progress note created (see row info) Yes  Assess: SIRS CRITERIA  SIRS Temperature  0  SIRS Pulse 1  SIRS Respirations  0  SIRS WBC 1  SIRS Score Sum  2

## 2022-04-04 NOTE — Progress Notes (Signed)
    Assessment & Plan: POD#2 - status post lap right colectomy for appendiceal Ca - 10/6 - Dr. Hassell Done             Full liquid diet             Encourage ambulation, OOB             Await path             Pain Rx  Await resolution of ileus  Patient requests sleep aid        Armandina Gemma, [facility] [place] practice Office: [phone]        Chief Complaint: Appendiceal Ca  Subjective: Patient up in room, wife at bedside.  Tolerating full liquid diet.  Passing flatus, no BM.  Objective: Vital signs in last 24 hours: Temp:  [97.9 F (36.6 C)-98.6 F (37 C)] 97.9 F (36.6 C) (10/08 0507) Pulse Rate:  [81-110] 110 (10/08 0507) Resp:  [16-18] 16 (10/08 0507) BP: (129-161)/(85-88) 129/87 (10/08 0507) SpO2:  [96 %] 96 % (10/08 0507) Last BM Date : 04/01/22  Intake/Output from previous day: 10/07 0701 - 10/08 0700 In: 2124.7 [P.O.:1020; I.V.:1104.7] Out: 1285 [Urine:1285] Intake/Output this shift: No intake/output data recorded.  Physical Exam: HEENT - sclerae clear, mucous membranes moist Neck - soft Abdomen - soft, obese; dressing dry and intact; rare BS present Ext - no edema, non-tender Neuro - alert & oriented, no focal deficits  Lab Results:  Recent Labs    04/02/22 0741 04/03/22 0507  WBC 6.9 17.5*  HGB 15.2 13.8  HCT 47.4 42.4  PLT 183 187   BMET Recent Labs    04/02/22 0741 04/03/22 0507  NA 140 139  K MARKED HEMOLYSIS 3.9  CL 108 107  CO2 25 26  GLUCOSE 105* 154*  BUN 13 12  CREATININE 1.19 1.20  CALCIUM 9.0 8.8*   PT/INR No results for input(s): "LABPROT", "INR" in the last 72 hours. Comprehensive Metabolic Panel:    Component Value Date/Time   NA 139 04/03/2022 0507   NA 140 04/02/2022 0741   K 3.9 04/03/2022 0507   K MARKED HEMOLYSIS 04/02/2022 0741   CL 107 04/03/2022 0507   CL 108 04/02/2022 0741   CO2 26 04/03/2022 0507   CO2 25 04/02/2022 0741   BUN 12 04/03/2022 0507   BUN 13 04/02/2022 0741    CREATININE 1.20 04/03/2022 0507   CREATININE 1.19 04/02/2022 0741   GLUCOSE 154 (H) 04/03/2022 0507   GLUCOSE 105 (H) 04/02/2022 0741   CALCIUM 8.8 (L) 04/03/2022 0507   CALCIUM 9.0 04/02/2022 0741   AST 22 03/17/2022 0427   AST 21 11/27/2020 1651   ALT 20 03/17/2022 0427   ALT 18 11/27/2020 1651   ALKPHOS 53 03/17/2022 0427   ALKPHOS 57 11/27/2020 1651   BILITOT 1.0 03/17/2022 0427   BILITOT 0.7 11/27/2020 1651   PROT 6.3 (L) 03/17/2022 0427   PROT 7.2 11/27/2020 1651   ALBUMIN 3.2 (L) 03/17/2022 0427   ALBUMIN 3.7 11/27/2020 1651    Studies/Results: No results found.    Armandina Gemma 04/04/2022   Patient ID: James Mckinney, male   DOB: 1945-12-19, 76 y.o.   MRN: [id number]

## 2022-04-05 LAB — CBC WITH DIFFERENTIAL/PLATELET
Abs Immature Granulocytes: 0.05 10*3/uL (ref 0.00–0.07)
Basophils Absolute: 0.1 10*3/uL (ref 0.0–0.1)
Basophils Relative: 0 %
Eosinophils Absolute: 0.3 10*3/uL (ref 0.0–0.5)
Eosinophils Relative: 3 %
HCT: 43 % (ref 39.0–52.0)
Hemoglobin: 13.9 g/dL (ref 13.0–17.0)
Immature Granulocytes: 0 %
Lymphocytes Relative: 19 %
Lymphs Abs: 2.4 10*3/uL (ref 0.7–4.0)
MCH: 27.5 pg (ref 26.0–34.0)
MCHC: 32.3 g/dL (ref 30.0–36.0)
MCV: 85.1 fL (ref 80.0–100.0)
Monocytes Absolute: 0.8 10*3/uL (ref 0.1–1.0)
Monocytes Relative: 6 %
Neutro Abs: 9.3 10*3/uL — ABNORMAL HIGH (ref 1.7–7.7)
Neutrophils Relative %: 72 %
Platelets: 221 10*3/uL (ref 150–400)
RBC: 5.05 MIL/uL (ref 4.22–5.81)
RDW: 13.8 % (ref 11.5–15.5)
WBC: 12.9 10*3/uL — ABNORMAL HIGH (ref 4.0–10.5)
nRBC: 0 % (ref 0.0–0.2)

## 2022-04-05 NOTE — Progress Notes (Signed)
Patient is stable for discharge, patient has been given discharge instructions. Patient understood instructions, all questions answered. Patient is discharged to home with wife.

## 2022-04-05 NOTE — Discharge Summary (Signed)
Physician Discharge Summary  Patient ID: James Mckinney MRN: [id number] DOB/AGE: 76-16-1947 76 y.o.  PCP: Haywood Pao, MD  Admit date: 04/02/2022 Discharge date: 04/05/2022  Admission Diagnoses:  cancer of appendix  Discharge Diagnoses:  same-path pending  Principal Problem:   Primary cancer of appendix [facility]) Active Problems:   Cancer of appendix [facility])   Surgery:  lap assisted right hemicolectomy  Discharged Condition: improved  Hospital Course:   Had surgery on Friday.  Begun on liquids and advanced to full liquids.  Started on soft foods on Monday.  Patient really wanting to go home since he cannot rest here.  Incisions ok  Consults: none  Significant Diagnostic Studies: path pending    Discharge Exam: Blood pressure 132/86, pulse (!) 102, temperature (!) 97.3 F (36.3 C), temperature source Oral, resp. rate 16, height 6' (1.829 m), weight 114.3 kg, SpO2 98 %. Incisions healing OK  Disposition: Discharge disposition: 01-Home or Self Care       Discharge Instructions     Call MD for:  redness, tenderness, or signs of infection (pain, swelling, redness, odor or green/yellow discharge around incision site)   Complete by: As directed    Discharge instructions   Complete by: As directed    Remove midline dressing tomorrow and shower.   Staple removal in midline in 10-14 days from surgery Soft diet for one week before advancing to regular Your path report is still pending so call office tomorrow and request path report AND set up a time for staple removal.   Discharge wound care:   Complete by: As directed    Staples out in 10-14 days Take prior script for pain as needed.   Increase activity slowly   Complete by: As directed       Allergies as of 04/05/2022       Reactions   Zestril [lisinopril] Cough        Medication List     TAKE these medications    acetaminophen 500 MG tablet Commonly known as: TYLENOL Take 1,000 mg by mouth every 6  (six) hours as needed for mild pain.   aspirin EC 81 MG tablet Take 81 mg by mouth daily.   diphenhydramine-acetaminophen 25-500 MG Tabs tablet Commonly known as: TYLENOL PM Take 1 tablet by mouth at bedtime as needed (Sleep).   dorzolamide 2 % ophthalmic solution Commonly known as: TRUSOPT Place 1 drop into both eyes 2 (two) times daily.   HYDROcodone-acetaminophen 5-325 MG tablet Commonly known as: NORCO/VICODIN Take 1-2 tablets by mouth every 4 (four) hours as needed for moderate pain.   latanoprost 0.005 % ophthalmic solution Commonly known as: XALATAN Place 1 drop into both eyes at bedtime.   lisinopril 20 MG tablet Commonly known as: ZESTRIL Take 20 mg by mouth daily.   ondansetron 4 MG disintegrating tablet Commonly known as: ZOFRAN-ODT Take 1 tablet (4 mg total) by mouth every 6 (six) hours as needed for nausea.   One-A-Day Mens 50+ Tabs Take 1 tablet by mouth daily.   rosuvastatin 10 MG tablet Commonly known as: CRESTOR Take 10 mg by mouth daily.   VITAMIN D PO Take 2,000 Units by mouth daily.               Discharge Care Instructions  (From admission, onward)           Start     Ordered   04/05/22 0000  Discharge wound care:       Comments: Staples out  in 10-14 days Take prior script for pain as needed.   04/05/22 1051            Follow-up Information     Johnathan Hausen, MD Follow up in 3 week(s).   Specialty: General Surgery Why: For routine follow up with Dr. Carter Kitten information: [place] [id number] [phone]                 Signed: Pedro Earls 04/05/2022, 10:53 AM

## 2022-04-06 LAB — SURGICAL PATHOLOGY

## 2022-04-09 NOTE — Progress Notes (Signed)
RN spoke with patient to confirm upcoming treatment plan involving his new patient appointment with Dr. Benay Spice.   Patient verbalized understanding and agreement.   Pt feeling overwhelmed and worried about missing or confusing appointment time and dates.   RN will mail calendar will upcoming appointment time and dates, including upcoming follow up with Dr. Hassell Done on 11/3.   RN will place referral for spiritual care to reach out to patient as well.   Plan of care in progress.

## 2022-04-12 ENCOUNTER — Encounter: Payer: Self-pay | Admitting: General Practice

## 2022-04-12 NOTE — Progress Notes (Signed)
[place] Spiritual Care Note  Referred by nursing for additional layer of emotional support, given Mr Nunes's recent diagnoses and treatment. Reached him by phone. He was very welcoming of [place] and notes how many very kind people he has met through his recent health needs. Mr Mooneyhan notes that his wife is a very strong supporter and that he is coping as best he can. He values a supportive conversation partner from the healthcare side, too.   We plan to follow up by phone a few days after his appointment with Dr Benay Spice to provide an opportunity for more processing and reflection, and Mr Surrette has direct Spiritual Care number in case needs arise in the meantime.   [place], [place], [facility] Pager [phone] Voicemail [phone]

## 2022-04-21 ENCOUNTER — Encounter: Payer: Self-pay | Admitting: *Deleted

## 2022-04-21 ENCOUNTER — Inpatient Hospital Stay: Payer: Medicare HMO | Attending: Radiation Oncology | Admitting: Oncology

## 2022-04-21 ENCOUNTER — Other Ambulatory Visit: Payer: Self-pay

## 2022-04-21 ENCOUNTER — Inpatient Hospital Stay: Payer: Medicare HMO | Admitting: Nutrition

## 2022-04-21 ENCOUNTER — Inpatient Hospital Stay: Payer: Medicare HMO | Admitting: Licensed Clinical Social Worker

## 2022-04-21 VITALS — BP 128/76 | HR 70 | Temp 98.2°F | Resp 18 | Ht 72.0 in | Wt 233.2 lb

## 2022-04-21 DIAGNOSIS — Z8042 Family history of malignant neoplasm of prostate: Secondary | ICD-10-CM

## 2022-04-21 DIAGNOSIS — Z803 Family history of malignant neoplasm of breast: Secondary | ICD-10-CM | POA: Diagnosis not present

## 2022-04-21 DIAGNOSIS — I82512 Chronic embolism and thrombosis of left femoral vein: Secondary | ICD-10-CM | POA: Diagnosis not present

## 2022-04-21 DIAGNOSIS — Z800 Family history of malignant neoplasm of digestive organs: Secondary | ICD-10-CM

## 2022-04-21 DIAGNOSIS — I10 Essential (primary) hypertension: Secondary | ICD-10-CM | POA: Diagnosis not present

## 2022-04-21 DIAGNOSIS — C61 Malignant neoplasm of prostate: Secondary | ICD-10-CM | POA: Diagnosis not present

## 2022-04-21 DIAGNOSIS — Z801 Family history of malignant neoplasm of trachea, bronchus and lung: Secondary | ICD-10-CM

## 2022-04-21 DIAGNOSIS — C181 Malignant neoplasm of appendix: Secondary | ICD-10-CM

## 2022-04-21 DIAGNOSIS — R918 Other nonspecific abnormal finding of lung field: Secondary | ICD-10-CM | POA: Diagnosis not present

## 2022-04-21 DIAGNOSIS — H409 Unspecified glaucoma: Secondary | ICD-10-CM | POA: Diagnosis not present

## 2022-04-21 DIAGNOSIS — Z807 Family history of other malignant neoplasms of lymphoid, hematopoietic and related tissues: Secondary | ICD-10-CM

## 2022-04-21 NOTE — Progress Notes (Signed)
[place] CSW Progress Note  Clinical Education officer, museum contacted patient by phone to assess psychosocial needs.  Received referral from nurse.  CSW informed patient of the reason for the phone call.  Acknowledged adjusting to illness.  He agreed to speak further with CSW, but stated now was not a good time.  Patient agreed to have CSW call tomorrow in the a.m.      Rodman Pickle Whitley Strycharz, LCSW

## 2022-04-21 NOTE — Progress Notes (Signed)
PATIENT NAVIGATOR PROGRESS NOTE  Name: James Mckinney Date: 04/21/2022 MRN: [id number]  DOB: [birth year]   Reason for visit:  New patient appt  Comments:  Met with Mr and Mrs Busic during visit with Dr Benay Spice Referral to dietician and met with Dory Peru today Will order MSI and IHC testing on surgical pathology Will obtain CEA at next appointment with Dr Benay Spice in 6 mos Given contact information to call with any issues or questions    Time spent counseling/coordinating care: > 60 minutes

## 2022-04-21 NOTE — Progress Notes (Unsigned)
[facility] New Patient Consult   Requesting MD: Margaretmary Dys, Md [address] [place],  [place] [address]   James Mckinney 76 y.o.  [birth year]    Reason for Consult: Appendix carcinoma   HPI: Mr. Treto followed by Dr. Laverle Patter in [place] with a history of prostate cancer.  He underwent a prostate MRI on 01/04/2022.  Revealed for peripheral lesions and a potentially enhancing segment of the appendix.  There was evidence of a possible chronic DVT in the left superficial femoral vein.  A CT abdomen pelvis 01/25/2022 revealed prostamegaly no evidence of metastatic disease.  The appendix was dilated up to 1.4 cm, new compared to a scan from 2017, consistent with an appendiceal mucocele. A PSMA PET on 02/17/2022 showed no suspicious pulmonary nodules on the CT scan.  There is a focus of prostate cancer in the left peripheral zone without evidence of metastatic adenopathy or skeletal metastases.  Mr. Hudlow is referred to Dr. Lequita Halt and was taken to the operating room for laparoscopic appendectomy 03/16/2022.  The cecum appeared "Chronically inflamed "with adhesions to the anterior abdominal wall.  The appendix was fixed in the retrocecal region.  There was felt to be an unrecognized appendicitis.  An appendectomy was performed. The pathology a low-grade goblet cell adenocarcinoma tumor invading into the mesoappendix and subserosal without involvement of the serosal surface.  No lymphovascular invasion.  All margins are negative for invasive carcinoma.  Perineural invasion is present.  Arteries are negative for noninvasive tumor and mucin.  No lymph nodes were submitted.  He was taken back to the operating room for a laparoscopic assisted right hemicolectomy on 04/02/2022.  The pathology revealed no residual adenocarcinoma and no metastatic carcinoma in 8 lymph nodes.  He reports feeling completely well prior to the appendectomy.  Past Medical History:  Diagnosis Date    Adenocarcinoma, appendix (HCC)    Anemia    Anxiety    Arthritis    Depression    Family history of breast cancer 02/22/2022   Family history of prostate cancer 02/22/2022   GERD (gastroesophageal reflux disease)    Glaucoma    Headache    Hypertension    Kidney stone    Nephrolithiasis 11/2015   Pneumonia    PTSD (post-traumatic stress disorder)    Stroke (HCC) right pontine lacunar infarct 2022   mini per pt persistent left facial, arm and hand numbness    Past Surgical History:  Procedure Laterality Date   COLONOSCOPY     LAPAROSCOPIC APPENDECTOMY N/A 03/16/2022   Procedure: APPENDECTOMY LAPAROSCOPIC;  Surgeon: Luretha Murphy, MD;  Location: [facility];  Service: General;  Laterality: N/A;  90 MIN - ROOM 10   LAPAROSCOPIC PARTIAL RIGHT COLECTOMY Right 04/02/2022   Procedure: LAPAROSCOPIC ASSISTED RIGHT COLECTOMY;  Surgeon: Luretha Murphy, MD;  Location: [facility];  Service: General;  Laterality: Right;   NO PAST SURGERIES     WISDOM TOOTH EXTRACTION      .  Repair of left jaw fracture  Medications: Reviewed  Allergies:  Allergies  Allergen Reactions   Zestril [Lisinopril] Cough    Family history: lung and prostate cancer in his father, sister with multiple myeloma, brother with "cancer ".  Mother with an abdominal cancer.  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  Does not use cigarettes or alcohol ROS:   Positives include: Urinary frequency, frequent belching  A complete ROS was otherwise negative.  Physical Exam:  Blood pressure 128/76, pulse 70, temperature 98.2 F (36.8 C), temperature source Oral, resp. rate 18, height 6' (1.829 m), weight 233 lb 3.2 oz (105.8 kg), SpO2 99 %.  HEENT: Pharynx without visible mass, neck without mass Lungs: Clear bilaterally Cardiac: Regular rate and rhythm Abdomen: No hepatosplenomegaly  Vascular: Trace edema the left greater than right lower leg, no erythema or palpable cord in  the left leg Lymph nodes: No cervical, supraclavicular, axillary, or inguinal nodes Neurologic: Alert and oriented, the motor exam appears intact in the upper and lower extremities bilaterally Skin: No rash Musculoskeletal: Spine tenderness   LAB:  CBC  Lab Results  Component Value Date   WBC 12.9 (H) 04/05/2022   HGB 13.9 04/05/2022   HCT 43.0 04/05/2022   MCV 85.1 04/05/2022   PLT 221 04/05/2022   NEUTROABS 9.3 (H) 04/05/2022        CMP  Lab Results  Component Value Date   NA 139 04/03/2022   K 3.9 04/03/2022   CL 107 04/03/2022   CO2 26 04/03/2022   GLUCOSE 154 (H) 04/03/2022   BUN 12 04/03/2022   CREATININE 1.20 04/03/2022   CALCIUM 8.8 (L) 04/03/2022   PROT 6.3 (L) 03/17/2022   ALBUMIN 3.2 (L) 03/17/2022   AST 22 03/17/2022   ALT 20 03/17/2022   ALKPHOS 53 03/17/2022   BILITOT 1.0 03/17/2022   GFRNONAA >60 04/03/2022   GFRAA >60 12/05/2015     Lab Results  Component Value Date   CEA1 2.1 03/12/2022    Imaging:  As per HPI, CT abdomen/pelvis from 01/25/2022 reviewed with Mr. Dogan his wife    Assessment/Plan:   Goblet cell adenocarcinoma of the appendix, stage 2A (T3N0) Appendectomy 03/16/2022 low-grade blood cell adenocarcinoma with abundant extracellular mucin diffusely infiltrating the appendiceal wall and focally invading the periappendiceal adipose, pT3, margins negative, perineural invasion present, no lymphovascular invasion, all margins negative for noninvasive tumor and mucin, all margins negative for invasive carcinoma Right colon resection 04/02/2022, no residual adenocarcinoma, no metastatic carcinoma in 8 lymph nodes MRI prostate 01/04/2022-venous varix versus dilated and potentially enhancing segment of the appendix CT abdomen/pelvis 01/25/2022-prostamegaly dilated appendix new from 2017 consistent with an appendiceal mucocele  2.   Chronic DVT in the left proximal superficial femoral vein on prostate MRI 01/04/2022 3.   Prostate  cancer Gleason 3+3 prostate cancer in 2019, 2% in 1/12 cores on biopsy 04/19/2017 PSA 11 at time of diagnosis PSA 620 2315.8 Prostate MRI 01/05/2019 23-4 peripheral zone lesions, 2 PI-RADS 5 on the left, 1 PI-RADS left, and 1 PI-RADS 3 on the right CT abdomen/pelvis 01/25/2022-severe prostamegaly, no evidence of lymphadenopathy or osseous metastatic disease MRI fusion biopsy of the prostate 02/03/2019 23-8/28 core biopsies positive with maximum Gleason score 4+4 PSMA PET 02/17/2022-focal prostate adenocarcinoma in the left peripheral zone, no evidence of metastatic adenopathy, no evidence of visceral metastases or skeletal metastases He began androgen deprivation therapy and is scheduled to begin external beam radiation 2 months later  4.  Family history of multiple cancers-[place]ry genetic panel-MSH3 and MSH6 VUS  5.  Glaucoma   Disposition:   Mr Pitter has been diagnosed with adenocarcinoma the appendix after found to have a dilated appendix on a prostate MRI.  He underwent an appendectomy followed by a right colectomy for a T3N0 appendix cancer.  I discussed the diagnosis of appendix carcinoma and reviewed details of the surgical pathology report with Mr Stauffer and his wife. I explained treatment for appendix carcinoma  is similar to patients with colorectal carcinoma.  He has a good prognosis for a long-term disease-free survival.  The tumor has a low-grade with paraneural invasion and a limited number of lymph nodes sampled being the only high risk features.  He is currently being treated for prostate cancer.  We discussed the potential small absolute benefit with adjuvant 5-fluorouracil based chemotherapy.  Mr Seydel is comfortable with observation.  I will present his case at the GI tumor conference.  We will request MSI and IHC testing the resected tumor.  He will continue management of the prostate cancer with Drs. Borden and [employer].  He will return for an office visit and CEA in 6 months.  I  recommend he undergo a screening colonoscopy within the next year.  Betsy Coder, MD  04/21/2022, 1:45 PM

## 2022-04-21 NOTE — Progress Notes (Signed)
76 year old male diagnosed with Appendix cancer and followed by Dr. Benay Spice.  PMH includes hemi-colectomy on Oct 6, Prostate cancer, Kidney Stones, Anemia, Anxiety, Depression, GERD, HTN, PTSD, and Stroke.  Medications include Vitamin D, Crestor, and MVI  Labs include Glucose 154 on Oct 7.  Height: 6" Weight: 233 pounds 3.2 oz UBW: 250 pounds BMI: 31.63.  Patient reports he would like to know what foods are included on the soft diet which he was instructed to eat post surgery. He also would like to make some healthier choices since he was diagnosed.  Nutrition Diagnosis: Food and Nutrition Related Knowledge Deficit related to cancer and associated treatments as evidenced by no prior need for nutrition related information.  Intervention: Educated on diet after Intestinal Surgery and provided copy of handout. Discouraged intentional weight loss. Educated on healthy plant based diet with adequate protein for weight maintenance or slow healthy wt loss. Provided nutrition fact sheets. Questions answered. Teach back used. Contact information provided.  Monitoring, Evaluation, Goals: Patient will tolerate diet advancement to provided adequate calories and protein for minimal wt loss  Next Visit: To be scheduled as needed.

## 2022-04-22 ENCOUNTER — Inpatient Hospital Stay: Payer: Medicare HMO

## 2022-04-22 ENCOUNTER — Telehealth: Payer: Self-pay

## 2022-04-22 LAB — SURGICAL PATHOLOGY

## 2022-04-22 NOTE — Telephone Encounter (Signed)
CSW attempted to contact patient to assess needs.  Left vm. 

## 2022-04-28 ENCOUNTER — Other Ambulatory Visit: Payer: Self-pay

## 2022-04-28 NOTE — Progress Notes (Signed)
The proposed treatment discussed in conference is for discussion purpose only and is not a binding recommendation.  The patients have not been physically examined, or presented with their treatment options.  Therefore, final treatment plans cannot be decided.  

## 2022-05-11 DIAGNOSIS — N401 Enlarged prostate with lower urinary tract symptoms: Secondary | ICD-10-CM | POA: Diagnosis not present

## 2022-05-11 DIAGNOSIS — C61 Malignant neoplasm of prostate: Secondary | ICD-10-CM | POA: Diagnosis not present

## 2022-05-18 ENCOUNTER — Encounter (HOSPITAL_BASED_OUTPATIENT_CLINIC_OR_DEPARTMENT_OTHER): Payer: Self-pay | Admitting: Urology

## 2022-05-18 NOTE — Progress Notes (Addendum)
Addendum:   received pt's pcp clearance via fax from Dr Alinda Money office, placed in chart.   Spoke w/ via phone for pre-op interview---pt Lab needs dos---- [employer] results------ current EKG in epic/ chart COVID test -----patient states asymptomatic no test needed Arrive at ------- 0630 on  05-25-2022 NPO after MN NO Solid Food.  Clear liquids from MN until--- 0530 Med rec completed Medications to take morning of surgery -----  crestor Diabetic medication ----- n/a Patient instructed no nail polish to be worn day of surgery Patient instructed to bring photo id and insurance card day of surgery Patient aware to have Driver (ride ) / caregiver for 24 hours after surgery -- wife, rosette Patient Special Instructions ----- will do one fleet enema prior to surgery. Pre-Op special Istructions ----- per office pt has clearance from pcp Dr Osborne Casco may stop asa 5 days prior to surgery.  Pt stated was given instruction by pcp.  Called to have clearance faxed, left message w/ [facility], OR scheduler. Patient verbalized understanding of instructions that were given at this phone interview. Patient denies shortness of breath, chest pain, fever, cough at this phone interview.

## 2022-05-24 DIAGNOSIS — Z191 Hormone sensitive malignancy status: Secondary | ICD-10-CM | POA: Diagnosis not present

## 2022-05-24 DIAGNOSIS — C61 Malignant neoplasm of prostate: Secondary | ICD-10-CM | POA: Diagnosis not present

## 2022-05-24 NOTE — Anesthesia Preprocedure Evaluation (Signed)
Anesthesia Evaluation  Patient identified by MRN, date of birth, ID band Patient awake    Reviewed: Allergy & Precautions, NPO status , Patient's Chart, lab work & pertinent test results  Airway Mallampati: II  TM Distance: >3 FB Neck ROM: Full    Dental no notable dental hx. (+) Teeth Intact, Dental Advisory Given,    Pulmonary sleep apnea and Continuous Positive Airway Pressure Ventilation    Pulmonary exam normal breath sounds clear to auscultation       Cardiovascular hypertension, Normal cardiovascular exam Rhythm:Regular Rate:Normal  12/2020 TTE  1. Left ventricular ejection fraction, by estimation, is 55 to 60%. The  left ventricle has normal function. The left ventricle has no regional  wall motion abnormalities. Left ventricular diastolic parameters are  consistent with Grade I diastolic  dysfunction (impaired relaxation).   2. Right ventricular systolic function is normal. The right ventricular  size is normal. Tricuspid regurgitation signal is inadequate for assessing  PA pressure.   3. The mitral valve is normal in structure. No evidence of mitral valve  regurgitation. No evidence of mitral stenosis.   4. The aortic valve is tricuspid. There is mild thickening of the aortic  valve. Aortic valve regurgitation is not visualized. No aortic stenosis is  present.   5. The inferior vena cava is normal in size with greater than 50%  respiratory variability, suggesting right atrial pressure of 3 mmHg.     Neuro/Psych  PSYCHIATRIC DISORDERS Anxiety Depression    PTSDTIA   GI/Hepatic ,GERD  ,,(+) Hepatitis -, CHx Of Appendiceal CA S/p hemicolectomy   Endo/Other    Renal/GU Lab Results      Component                Value               Date                      CREATININE               1.00                05/25/2022                BUN                      13                  05/25/2022                NA                        146 (H)             05/25/2022                K                        3.9                 05/25/2022                CL                       104                 05/25/2022  Prostate CA    Musculoskeletal  (+) Arthritis ,    Abdominal   Peds  Hematology Lab Results      Component                Value               Date                              HGB                      17.3 (H)            05/25/2022                HCT                      51.0                05/25/2022                Anesthesia Other Findings All: zestril  Reproductive/Obstetrics                             Anesthesia Physical Anesthesia Plan  ASA: 3  Anesthesia Plan: MAC   Post-op Pain Management:    Induction: Intravenous  PONV Risk Score and Plan: 2 and Treatment may vary due to age or medical condition, Ondansetron and Midazolam  Airway Management Planned: Nasal Cannula and Natural Airway  Additional Equipment: None  Intra-op Plan:   Post-operative Plan:   Informed Consent: I have reviewed the patients History and Physical, chart, labs and discussed the procedure including the risks, benefits and alternatives for the proposed anesthesia with the patient or authorized representative who has indicated his/her understanding and acceptance.     Dental advisory given  Plan Discussed with:   Anesthesia Plan Comments:        Anesthesia Quick Evaluation

## 2022-05-24 NOTE — Progress Notes (Signed)
  Radiation Oncology         [phone]) [phone] ________________________________  Name: James Mckinney MRN: [id number]  Date: 05/27/2022  DOB: Aug 10, 1945  SIMULATION AND TREATMENT PLANNING NOTE    ICD-10-CM   1. Malignant neoplasm of prostate ([place])  C61       DIAGNOSIS:  76 y.o. gentleman with Stage T2a adenocarcinoma of the prostate with a Gleason score of 4+4 and a PSA of 18.02.    NARRATIVE:  The patient was brought to the [facility].  Identity was confirmed.  All relevant records and images related to the planned course of therapy were reviewed.  The patient freely provided informed written consent to proceed with treatment after reviewing the details related to the planned course of therapy. The consent form was witnessed and verified by the simulation staff.  Then, the patient was set-up in a stable reproducible supine position for radiation therapy.  A vacuum lock pillow device was custom fabricated to position his legs in a reproducible immobilized position.  Then, I performed a urethrogram under sterile conditions to identify the prostatic bed.  CT images were obtained.  Surface markings were placed.  The CT images were loaded into the planning software.  Then the prostate bed target, pelvic lymph node target and avoidance structures including the rectum, bladder, bowel and hips were contoured.  Treatment planning then occurred.  The radiation prescription was entered and confirmed.  A total of one complex treatment devices were fabricated. I have requested : Intensity Modulated Radiotherapy (IMRT) is medically necessary for this case for the following reason:  Rectal sparing.Marland Kitchen  PLAN:  The patient will receive 45 Gy in 25 fractions of 1.8 Gy, followed by a boost to the prostate to a total dose of 75 Gy with 15 additional fractions of 2 Gy.   ________________________________  Sheral Apley Tammi Klippel, M.D.

## 2022-05-24 NOTE — H&P (Signed)
CC/HPI: 05/11/2022: Patient with below noted history. Here today for preoperative appointment prior to undergoing fiducial marker placement and SpaceOAR on 11/28 in anticipation of beginning XRT for treatment of his underlying prostate cancer diagnosis. He remains on ADT with last Eligard administered in late August of this year.   He underwent laparoscopic appendectomy 03/16/2022. 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 No lymphovascular invasion. All margins are negative for invasive carcinoma. Perineural invasion is present. Arteries are negative for noninvasive tumor and mucin. No lymph nodes were submitted. He was taken back to the operating room for a laparoscopic assisted right hemicolectomy on 04/02/2022. The pathology revealed no residual adenocarcinoma and no metastatic carcinoma in 8 lymph nodes. Currently being followed by Dr. Benay Spice for this. Tentatively scheduled for return office visit and CEA in 6 months.   Patient has completely recovered from the above named procedures. He is not having any abdominal or pelvic pain. He endorses normal daily bowel movements. Denies any other changes in past medical history, prescription medications taken on daily basis. Denies any new or worsening lower urinary tract symptoms from previously noted baseline. No interval dysuria or gross hematuria, no interval treatment for UTI. He denies any recent fevers or chills, nausea/vomiting, chest pain or shortness of breath, lightheadedness or dizziness. He is having normal daily bowel movements. He has tolerated prior ADT well without significant hot flashes or excessive fatigue.   CC: Prostate Cancer   PCP: Dr. Domenick Gong  Location of consult: [facility] - Prostate Cancer Multidisciplinary [facility]   Mr. Crabbe is a 76 year old gentleman who has a PMH significant for TIA (2002),  hypertension, glaucoma, PTSD, hepatitis C, GERD, anxiety, depression, and sleep apnea. He was initially diagnosed with prostate cancer at the [facility] on 04/19/17 when he underwent a biopsy by Dr. Hal Neer indicating a 99 cc gland with 2% of Gleason 3+3=6 adenocarcinoma present in 1 out of 12 biopsy cores. His PSA at the time of diagnosis was 11.0. He did not undergo a confirmatory evaluation but proceeded with PSA monitoring. His PSA increased to 16.9 in March 2023 and he was recommended to undergo further evaluation.   He presented to me initially in June. His PSA at that time was 15.8. An MRI of the prostate was performed on 01/04/22 and demonstrated multiple suspicious lesions. This prompted an MR/US fusion biopsy on 02/02/22. A total of 28 biopsy cores were obtained with 6 positive for malignancy. He did have Gleason 4+4=8 adenocarcinoma present indicating upgraded disease. He has a PSMA PET scan is scheduled to be done tomorrow.   He does have baseline LUTS with an IPSS of 20 on tamsulosin. His prostate volume is now estimated to be almost 200 cc.   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 A general surgery referral has been placed. He is scheduled to see Dr. Hassell Done on Thursday for further evaluation.   Family history: None   Imaging studies: PSMA PET imaging -this is scheduled for tomorrow.   PMH: He has a history of TIA (2002), hypertension, glaucoma, PTSD, hepatitis C, GERD, anxiety, depression, and sleep apnea.  PSH: No abdominal surgeries.   TNM stage: cT1 N0 M0  PSA: 15.8  Gleason score: 4+4=8 (GG 4)  Biopsy (02/02/22): 6/28  cores positive  Left: L apex (50%, 4+3=7)  Right: Benign  MR targets: ROI-1 (Benign), ROI-2 (L apex) - 1/4 cores, Gleason 3+4=7, 5%, ROI-3 (L apex/mid) - 2/4 cores, PNI 4+4=8, 70%, 60%, ROI-4 (R base) 2/4 cores,  3+3=6, 5%, 3+4=7, 5%  Prostate volume: 206.9 cc   Urinary function: IPSS is 20.  Erectile function: SHIM score is 1.     ALLERGIES: No Known Drug Allergies    MEDICATIONS: Aspirin 325 mg tablet  Tamsulosin Hcl  Hydroxyzine Hcl  Ibuprofen  Timolol-Latanoprost  Tylenol  Vitamin D2     GU PSH: Locm 300-'399Mg'$ /Ml Iodine,1Ml - 01/25/2022 Prostate Needle Biopsy - 02/02/2022     NON-GU PSH: Eye Surgery (Unspecified) Surgical Pathology, Gross And Microscopic Examination For Prostate Needle - 02/02/2022     GU PMH: Prostate Cancer - 02/23/2022, - 02/16/2022, - 02/02/2022, - 12/01/2021 Ureteral calculus - 2017 Ureteral obstruction secondary to calculous - 2017 Renal calculus    NON-GU PMH: Neoplasm of uncertain behavior of appendix - 01/25/2022 Anxiety Arthritis Depression GERD Hypertension Personal history of other diseases of the circulatory system Personal history of other diseases of the digestive system Personal history of other diseases of the musculoskeletal system and connective tissue Personal history of other diseases of the nervous system and sense organs    FAMILY HISTORY: 1 Daughter - No Family History 1 son - Other Gastric Cancer - Mother Lung Cancer - Father   SOCIAL HISTORY: Marital Status: Married Preferred Language: English; Ethnicity: Not Hispanic Or Latino; Race: Black or African American Current Smoking Status: Patient has never smoked.  Does not use smokeless tobacco. Has never drank.  Drinks 1 caffeinated drink per day.    REVIEW OF SYSTEMS:    GU Review Male:   Patient reports frequent urination, hard to postpone urination, get up at night to urinate, and stream starts and stops. Patient denies burning/ pain with urination, leakage of urine, trouble starting your streams, and have to strain to urinate .  Gastrointestinal (Upper):   Patient denies nausea and vomiting.  Gastrointestinal (Lower):   Patient denies diarrhea and constipation.   Constitutional:   Patient denies fever, night sweats, weight loss, and fatigue.  Skin:   Patient denies skin rash/ lesion and itching.  Eyes:   Patient denies blurred vision and double vision.  Ears/ Nose/ Throat:   Patient denies sore throat and sinus problems.  Hematologic/Lymphatic:   Patient denies swollen glands and easy bruising.  Cardiovascular:   Patient denies leg swelling and chest pains.  Respiratory:   Patient denies cough and shortness of breath.  Endocrine:   Patient denies excessive thirst.  Musculoskeletal:   Patient denies back pain and joint pain.  Neurological:   Patient denies headaches and dizziness.  Psychologic:   Patient denies depression and anxiety.   Notes: Reviewed previous review of systems 12/01/2021. No changes.   VITAL SIGNS:      05/11/2022 08:20 AM  BP 145/89 mmHg  Pulse 69 /min  Temperature 97.8 F / 36.5 C   MULTI-SYSTEM PHYSICAL EXAMINATION:    Constitutional: Well-nourished. No physical deformities. Normally developed. Good grooming.  Neck: Neck symmetrical, not swollen. Normal tracheal position.  Respiratory: No labored breathing, no use of accessory muscles.   Cardiovascular: Normal temperature, normal extremity pulses, no swelling, no varicosities.  Skin: No paleness, no jaundice, no cyanosis. No lesion, no ulcer, no rash.  Neurologic / Psychiatric: Oriented to time, oriented to place, oriented to person. No depression, no anxiety, no agitation.  Gastrointestinal:  No hernia. Well healed incision. No mass, no tenderness, no rigidity, non obese abdomen.   Musculoskeletal: Normal gait and station of head and neck.     Complexity of Data:  Source Of History:  Patient, Medical Record Summary  Lab Test Review:   PSA  Records Review:   Pathology Reports, Previous Doctor Records, Previous Hospital Records, Previous Patient Records  Urine Test Review:   Urinalysis  X-Ray Review: PET- PSMA Scan: Reviewed Report.  C.T. Abdomen/Pelvis: Reviewed  Films. Reviewed Report.     12/01/21  PSA  Total PSA 15.80 ng/mL    05/11/22  Urinalysis  Urine Appearance Clear   Urine Color Yellow   Urine Glucose Neg mg/dL  Urine Bilirubin Neg mg/dL  Urine Ketones Neg mg/dL  Urine Specific Gravity 1.025   Urine Blood Neg ery/uL  Urine pH 6.0   Urine Protein Neg mg/dL  Urine Urobilinogen 0.2 mg/dL  Urine Nitrites Neg   Urine Leukocyte Esterase Neg leu/uL   PROCEDURES:          Urinalysis Dipstick Dipstick Cont'd  Color: Yellow Bilirubin: Neg mg/dL  Appearance: Clear Ketones: Neg mg/dL  Specific Gravity: 1.025 Blood: Neg ery/uL  pH: 6.0 Protein: Neg mg/dL  Glucose: Neg mg/dL Urobilinogen: 0.2 mg/dL    Nitrites: Neg    Leukocyte Esterase: Neg leu/uL    ASSESSMENT:      ICD-10 Details  1 GU:   Prostate Cancer - C61 Chronic, Threat to Bodily Function  2 NON-GU:   Encounter for other preprocedural examination - Z01.818 Undiagnosed New Problem   PLAN:            Medications Stop Meds: Lisinopril 20 mg tablet  Discontinue: 05/11/2022  - Reason: The patient did not comply with the medication's usage instructions.  Diazepam 10 mg tablet Take 10 mg 30-60 minutes prior to your MRI  Start: 12/16/2021  Discontinue: 05/11/2022  - Reason: The medication cycle was completed.  Diazepam 10 mg tablet Take 10 mg 30-60 minutes prior to your procedure  Start: 01/19/2022  Discontinue: 05/11/2022  - Reason: The medication cycle was completed.  Levofloxacin 750 mg tablet Please take one tablet the morning of your biopsy.  Start: 01/19/2022  Discontinue: 05/11/2022  - Reason: The medication cycle was completed.            Orders Labs Urine Culture          Schedule Return Visit/Planned Activity: Keep Scheduled Appointment - Follow up MD, Schedule Surgery          Document Letter(s):  Created for Patient: Clinical Summary         Notes:   I will update Dr. Alinda Money regarding his recent surgical history/cancer diagnosis.   All questions  answered to the best of my ability regarding the upcoming procedure and expected postoperative course with understanding expressed by the patient. Urine culture sent today to serve as a baseline. He will proceed with previously scheduled fiducial marker placement and SpaceOAR on 11/28.

## 2022-05-25 ENCOUNTER — Ambulatory Visit (HOSPITAL_BASED_OUTPATIENT_CLINIC_OR_DEPARTMENT_OTHER)
Admission: RE | Admit: 2022-05-25 | Discharge: 2022-05-25 | Disposition: A | Discharge status: Home / Self Care | Payer: Medicare HMO | Attending: Urology | Admitting: Urology

## 2022-05-25 ENCOUNTER — Encounter (HOSPITAL_BASED_OUTPATIENT_CLINIC_OR_DEPARTMENT_OTHER)
Admission: RE | Disposition: A | Discharge status: Home / Self Care | Payer: Self-pay | Source: Home / Self Care | Attending: Urology

## 2022-05-25 ENCOUNTER — Ambulatory Visit (HOSPITAL_BASED_OUTPATIENT_CLINIC_OR_DEPARTMENT_OTHER): Payer: Medicare HMO | Admitting: Anesthesiology

## 2022-05-25 ENCOUNTER — Other Ambulatory Visit: Payer: Self-pay

## 2022-05-25 ENCOUNTER — Telehealth: Payer: Self-pay | Admitting: *Deleted

## 2022-05-25 ENCOUNTER — Encounter (HOSPITAL_BASED_OUTPATIENT_CLINIC_OR_DEPARTMENT_OTHER): Payer: Self-pay | Admitting: Urology

## 2022-05-25 DIAGNOSIS — G4733 Obstructive sleep apnea (adult) (pediatric): Secondary | ICD-10-CM

## 2022-05-25 DIAGNOSIS — C61 Malignant neoplasm of prostate: Secondary | ICD-10-CM | POA: Diagnosis not present

## 2022-05-25 DIAGNOSIS — F32A Depression, unspecified: Secondary | ICD-10-CM | POA: Diagnosis not present

## 2022-05-25 DIAGNOSIS — M1990 Unspecified osteoarthritis, unspecified site: Secondary | ICD-10-CM | POA: Insufficient documentation

## 2022-05-25 DIAGNOSIS — G4730 Sleep apnea, unspecified: Secondary | ICD-10-CM | POA: Insufficient documentation

## 2022-05-25 DIAGNOSIS — F419 Anxiety disorder, unspecified: Secondary | ICD-10-CM | POA: Insufficient documentation

## 2022-05-25 DIAGNOSIS — Z79899 Other long term (current) drug therapy: Secondary | ICD-10-CM | POA: Insufficient documentation

## 2022-05-25 DIAGNOSIS — I10 Essential (primary) hypertension: Secondary | ICD-10-CM | POA: Diagnosis not present

## 2022-05-25 DIAGNOSIS — F418 Other specified anxiety disorders: Secondary | ICD-10-CM | POA: Diagnosis not present

## 2022-05-25 DIAGNOSIS — Z9989 Dependence on other enabling machines and devices: Secondary | ICD-10-CM | POA: Diagnosis not present

## 2022-05-25 DIAGNOSIS — Z01818 Encounter for other preprocedural examination: Secondary | ICD-10-CM

## 2022-05-25 DIAGNOSIS — K219 Gastro-esophageal reflux disease without esophagitis: Secondary | ICD-10-CM | POA: Insufficient documentation

## 2022-05-25 HISTORY — DX: Contact with and (suspected) exposure to other hazardous, chiefly nonmedicinal, chemicals: Z77.098

## 2022-05-25 HISTORY — DX: Generalized anxiety disorder: F41.1

## 2022-05-25 HISTORY — DX: Presence of spectacles and contact lenses: Z97.3

## 2022-05-25 HISTORY — DX: Personal history of other infectious and parasitic diseases: Z86.19

## 2022-05-25 HISTORY — PX: GOLD SEED IMPLANT: SHX6343

## 2022-05-25 HISTORY — DX: Unspecified glaucoma: H40.9

## 2022-05-25 HISTORY — DX: Claustrophobia: F40.240

## 2022-05-25 HISTORY — DX: Unspecified symptoms and signs involving the genitourinary system: R39.9

## 2022-05-25 HISTORY — DX: Unspecified osteoarthritis, unspecified site: M19.90

## 2022-05-25 HISTORY — DX: Personal history of urinary calculi: Z87.442

## 2022-05-25 HISTORY — PX: SPACE OAR INSTILLATION: SHX6769

## 2022-05-25 HISTORY — DX: Obstructive sleep apnea (adult) (pediatric): G47.33

## 2022-05-25 LAB — POCT I-STAT, CHEM 8
BUN: 13 mg/dL (ref 8–23)
Calcium, Ion: 1.36 mmol/L (ref 1.15–1.40)
Chloride: 104 mmol/L (ref 98–111)
Creatinine, Ser: 1 mg/dL (ref 0.61–1.24)
Glucose, Bld: 95 mg/dL (ref 70–99)
HCT: 51 % (ref 39.0–52.0)
Hemoglobin: 17.3 g/dL — ABNORMAL HIGH (ref 13.0–17.0)
Potassium: 3.9 mmol/L (ref 3.5–5.1)
Sodium: 146 mmol/L — ABNORMAL HIGH (ref 135–145)
TCO2: 28 mmol/L (ref 22–32)

## 2022-05-25 SURGERY — INSERTION, GOLD SEEDS
Anesthesia: Monitor Anesthesia Care

## 2022-05-25 MED ORDER — FENTANYL CITRATE (PF) 100 MCG/2ML IJ SOLN: 25.0000 ug | INTRAMUSCULAR | Status: DC | PRN

## 2022-05-25 MED ORDER — SODIUM CHLORIDE 0.9% FLUSH
INTRAVENOUS | Status: DC | PRN
  Administered 2022-05-25: 10 mL

## 2022-05-25 MED ORDER — PROPOFOL 10 MG/ML IV BOLUS
INTRAVENOUS | Status: AC
  Filled 2022-05-25: qty 20

## 2022-05-25 MED ORDER — KETAMINE HCL 10 MG/ML IJ SOLN
INTRAMUSCULAR | Status: DC | PRN
  Administered 2022-05-25: 20 mg via INTRAVENOUS

## 2022-05-25 MED ORDER — BUPIVACAINE HCL 0.25 % IJ SOLN
INTRAMUSCULAR | Status: DC | PRN
  Administered 2022-05-25: 10 mL

## 2022-05-25 MED ORDER — FENTANYL CITRATE (PF) 100 MCG/2ML IJ SOLN
INTRAMUSCULAR | Status: DC | PRN
  Administered 2022-05-25 (×2): 25 ug via INTRAVENOUS

## 2022-05-25 MED ORDER — FLEET ENEMA 7-19 GM/118ML RE ENEM: 1.0000 | ENEMA | Freq: Once | RECTAL | Status: DC

## 2022-05-25 MED ORDER — CEFAZOLIN SODIUM-DEXTROSE 2-4 GM/100ML-% IV SOLN
INTRAVENOUS | Status: AC
  Filled 2022-05-25: qty 100

## 2022-05-25 MED ORDER — ACETAMINOPHEN 10 MG/ML IV SOLN
1000.0000 mg | Freq: Once | INTRAVENOUS | Status: DC | PRN
  Administered 2022-05-25: 1000 mg via INTRAVENOUS

## 2022-05-25 MED ORDER — ONDANSETRON HCL 4 MG/2ML IJ SOLN
INTRAMUSCULAR | Status: DC | PRN
  Administered 2022-05-25: 4 mg via INTRAVENOUS

## 2022-05-25 MED ORDER — PROPOFOL 10 MG/ML IV BOLUS
INTRAVENOUS | Status: DC | PRN
  Administered 2022-05-25: 20 mg via INTRAVENOUS

## 2022-05-25 MED ORDER — PROPOFOL 500 MG/50ML IV EMUL
INTRAVENOUS | Status: AC
  Filled 2022-05-25: qty 50

## 2022-05-25 MED ORDER — FENTANYL CITRATE (PF) 100 MCG/2ML IJ SOLN
INTRAMUSCULAR | Status: AC
  Filled 2022-05-25: qty 2

## 2022-05-25 MED ORDER — PROPOFOL 500 MG/50ML IV EMUL
INTRAVENOUS | Status: DC | PRN
  Administered 2022-05-25: 200 ug/kg/min via INTRAVENOUS

## 2022-05-25 MED ORDER — LACTATED RINGERS IV SOLN: INTRAVENOUS | Status: DC

## 2022-05-25 MED ORDER — ONDANSETRON HCL 4 MG/2ML IJ SOLN: 4.0000 mg | Freq: Once | INTRAMUSCULAR | Status: DC | PRN

## 2022-05-25 MED ORDER — ACETAMINOPHEN 10 MG/ML IV SOLN
INTRAVENOUS | Status: AC
  Filled 2022-05-25: qty 100

## 2022-05-25 MED ORDER — KETAMINE HCL 50 MG/5ML IJ SOSY
PREFILLED_SYRINGE | INTRAMUSCULAR | Status: AC
  Filled 2022-05-25: qty 5

## 2022-05-25 MED ORDER — CEFAZOLIN SODIUM-DEXTROSE 2-4 GM/100ML-% IV SOLN
2.0000 g | Freq: Once | INTRAVENOUS | Status: AC
  Administered 2022-05-25: 2 g via INTRAVENOUS

## 2022-05-25 SURGICAL SUPPLY — 26 items
BLADE CLIPPER SENSICLIP SURGIC (BLADE) ×1 IMPLANT
CNTNR URN SCR LID CUP LEK RST (MISCELLANEOUS) ×1 IMPLANT
CONT SPEC 4OZ STRL OR WHT (MISCELLANEOUS) ×1
COVER BACK TABLE 60X90IN (DRAPES) ×1 IMPLANT
DRSG TEGADERM 4X4.75 (GAUZE/BANDAGES/DRESSINGS) ×1 IMPLANT
DRSG TEGADERM 8X12 (GAUZE/BANDAGES/DRESSINGS) ×1 IMPLANT
GAUZE SPONGE 4X4 12PLY STRL (GAUZE/BANDAGES/DRESSINGS) ×1 IMPLANT
GAUZE SPONGE 4X4 12PLY STRL LF (GAUZE/BANDAGES/DRESSINGS) IMPLANT
GLOVE BIO SURGEON STRL SZ 6.5 (GLOVE) ×1 IMPLANT
GLOVE BIO SURGEON STRL SZ7.5 (GLOVE) ×1 IMPLANT
GLOVE ECLIPSE 8.0 STRL XLNG CF (GLOVE) ×1 IMPLANT
IMPL SPACEOAR VUE SYSTEM (Spacer) ×1 IMPLANT
IMPLANT SPACEOAR VUE SYSTEM (Spacer) ×1 IMPLANT
KIT TURNOVER CYSTO (KITS) ×1 IMPLANT
MARKER GOLD PRELOAD 1.2X3 (Urological Implant) ×1 IMPLANT
MARKER SKIN DUAL TIP RULER LAB (MISCELLANEOUS) ×1 IMPLANT
NDL SPNL 22GX3.5 QUINCKE BK (NEEDLE) ×1 IMPLANT
NEEDLE SPNL 22GX3.5 QUINCKE BK (NEEDLE) ×1 IMPLANT
SEED GOLD PRELOAD 1.2X3 (Urological Implant) ×1 IMPLANT
SHEATH ULTRASOUND LF (SHEATH) IMPLANT
SHEATH ULTRASOUND LTX NONSTRL (SHEATH) IMPLANT
SURGILUBE 2OZ TUBE FLIPTOP (MISCELLANEOUS) ×1 IMPLANT
SYR 10ML LL (SYRINGE) IMPLANT
SYR CONTROL 10ML LL (SYRINGE) ×1 IMPLANT
TOWEL OR 17X26 10 PK STRL BLUE (TOWEL DISPOSABLE) ×1 IMPLANT
UNDERPAD 30X36 HEAVY ABSORB (UNDERPADS AND DIAPERS) ×1 IMPLANT

## 2022-05-25 NOTE — Telephone Encounter (Signed)
CALLED PATIENT TO REMIND OF SIM APPT. FOR 05-27-22- ARRIVAL TIME- 7:45 AM @ [facility], INFORMED PATIENT TO ARRIVE WITH A FULL BLADDER, LVM FOR A RETURN CALL

## 2022-05-25 NOTE — Discharge Instructions (Addendum)
DISCHARGE INSTRUCTIONS FOR PROSTATE FIDUCIAL MARKERS AND SPACE OAR  Antibiotics You may be given a prescription for an antibiotic to take when you arrive home. If so, be sure to take every tablet in the bottle, even if you are feeling better before the prescription is finished. If you begin itching, notice a rash or start to swell on your trunk, arms, legs and/or throat, immediately stop taking the antibiotic and call your Urologist.  Diet Resume your usual diet when you return home. To keep your bowels moving easily and softly, drink prune, apple and cranberry juice at room temperature. You may also take a stool softener, such as Colace, which is available without prescription at local pharmacies.  Daily activities No driving or heavy lifting for at least two days after the implant. No bike riding, horseback riding or riding lawn mowers for the first month after the implant. Any strenuous physical activity should be approved by your doctor before you resume it.  Sexual relations You may resume sexual relations two weeks after the procedure.  Your semen may be dark brown or black; this is normal and is related bleeding that may have occurred during the implant.  Postoperative swelling Expect swelling and bruising of the scrotum and perineum (the area between the scrotum and anus). Both the swelling and the bruising should resolve in l or 2 weeks. Ice packs and over- the-counter medications such as Tylenol, Advil or Aleve may lessen your discomfort.  Postoperative urination Most men experience burning on urination and/or urinary frequency. If this becomes bothersome, contact your Urologist.  Medication can be prescribed to relieve these problems.  It is normal to have some blood in your urine for a few days after the implant.   Contact your doctor for Temperature greater than 101 F Increasing pain Inability to urinate  Follow-up  You should have follow up with your radiation oncologist  shortly after the procedure   Post Anesthesia Home Care Instructions  Activity: Get plenty of rest for the remainder of the day. A responsible individual must stay with you for 24 hours following the procedure.  For the next 24 hours, DO NOT: -Drive a car -Paediatric nurse -Drink alcoholic beverages -Take any medication unless instructed by your physician -Make any legal decisions or sign important papers.  Meals: Start with liquid foods such as gelatin or soup. Progress to regular foods as tolerated. Avoid greasy, spicy, heavy foods. If nausea and/or vomiting occur, drink only clear liquids until the nausea and/or vomiting subsides. Call your physician if vomiting continues.  Special Instructions/Symptoms: Your throat may feel dry or sore from the anesthesia or the breathing tube placed in your throat during surgery. If this causes discomfort, gargle with warm salt water. The discomfort should disappear within 24 hours.

## 2022-05-25 NOTE — Anesthesia Postprocedure Evaluation (Signed)
Anesthesia Post Note  Patient: Clenton Pare Hannula  Procedure(s) Performed: GOLD SEED IMPLANT SPACE OAR INSTILLATION     Patient location during evaluation: PACU Anesthesia Type: MAC Level of consciousness: awake and alert Pain management: pain level controlled Vital Signs Assessment: post-procedure vital signs reviewed and stable Respiratory status: spontaneous breathing, nonlabored ventilation, respiratory function stable and patient connected to nasal cannula oxygen Cardiovascular status: stable and blood pressure returned to baseline Postop Assessment: no apparent nausea or vomiting Anesthetic complications: no  No notable events documented.  Last Vitals:  Vitals:   05/25/22 1027 05/25/22 1107  BP: (!) 144/92 (!) 161/85  Pulse: 73 60  Resp: 18 16  Temp:  36.9 C  SpO2: 99% 100%    Last Pain:  Vitals:   05/25/22 1107  TempSrc: Oral  PainSc:                  Barnet Glasgow

## 2022-05-25 NOTE — Op Note (Signed)
Preoperative diagnosis: Clinically localized adenocarcinoma of the prostate  Postoperative diagnosis: Clinically localized adenocarcinoma of the prostate  Procedure: 1) Placement of fiducial markers into prostate                    2) Insertion of SpaceOAR hydrogel   Surgeon: Jacalyn Lefevre, MD   Anesthesia: General  EBL: Minimal  Complications: None  Indication: James Mckinney is a 76 y.o. gentleman with clinically localized prostate cancer. After discussing management options for treatment, he elected to proceed with radiotherapy. He presents today for the above procedures. The potential risks, complications, alternative options, and expected recovery course have been discussed in detail with the patient and he has provided informed consent to proceed.  Description of procedure: The patient was administered preoperative antibiotics, placed in the dorsal lithotomy position, and prepped and draped in the usual sterile fashion. Next, transrectal ultrasonography was utilized to visualize the prostate. Three gold fiducial markers were then placed into the prostate via transperineal needles under ultrasound guidance at the right apex, right base, and left mid gland under direct ultrasound guidance. A site in the midline was then selected on the perineum for placement of an 18 g needle with saline. The needle was advanced above the rectum and below Denonvillier's fascia to the mid gland and confirmed to be in the midline on transverse imaging. One cc of saline was injected confirming appropriate expansion of this space. A total of 5 cc of saline was then injected to open the space further bilaterally. The saline syringe was then removed and the SpaceOAR hydrogel was injected with good distribution bilaterally. He tolerated the procedure well and without complications. He was given a voiding trial prior to discharge from the PACU.

## 2022-05-25 NOTE — Anesthesia Procedure Notes (Signed)
Procedure Name: LMA Insertion Date/Time: 05/25/2022 9:26 AM  Performed by: Rogers Blocker, CRNAPre-anesthesia Checklist: Patient identified, Emergency Drugs available, Suction available and Patient being monitored Patient Re-evaluated:Patient Re-evaluated prior to induction Airway Equipment and Method: Oral airway Placement Confirmation: positive ETCO2 Dental Injury: Teeth and Oropharynx as per pre-operative assessment

## 2022-05-25 NOTE — Transfer of Care (Signed)
Immediate Anesthesia Transfer of Care Note  Patient: James Mckinney  Procedure(s) Performed: GOLD SEED IMPLANT SPACE OAR INSTILLATION  Patient Location: PACU  Anesthesia Type:MAC  Level of Consciousness: awake, alert , oriented, and patient cooperative  Airway & Oxygen Therapy: Patient Spontanous Breathing and Patient connected to face mask oxygen  Post-op Assessment: Report given to RN and Post -op Vital signs reviewed and stable  Post vital signs: Reviewed and stable  Last Vitals:  Vitals Value Taken Time  BP 152/87 05/25/22 0955  Temp    Pulse 82 05/25/22 0959  Resp 13 05/25/22 0959  SpO2 99 % 05/25/22 0959  Vitals shown include unvalidated device data.  Last Pain:  Vitals:   05/25/22 0645  TempSrc: Oral  PainSc: 0-No pain      Patients Stated Pain Goal: 5 (03/52/48 1859)  Complications: No notable events documented.

## 2022-05-25 NOTE — Interval H&P Note (Signed)
History and Physical Interval Note:  05/25/2022 8:23 AM  James Mckinney  has presented today for surgery, with the diagnosis of PROSTATE CANCER.  The various methods of treatment have been discussed with the patient and family. After consideration of risks, benefits and other options for treatment, the patient has consented to  Procedure(s): GOLD SEED IMPLANT (N/A) SPACE OAR INSTILLATION (N/A) as a surgical intervention.  The patient's history has been reviewed, patient examined, no change in status, stable for surgery.  I have reviewed the patient's chart and labs.  Questions were answered to the patient's satisfaction.     Zayneb Baucum D Dailey Buccheri

## 2022-05-26 ENCOUNTER — Encounter (HOSPITAL_BASED_OUTPATIENT_CLINIC_OR_DEPARTMENT_OTHER): Payer: Self-pay | Admitting: Urology

## 2022-05-27 ENCOUNTER — Ambulatory Visit
Admission: RE | Admit: 2022-05-27 | Discharge: 2022-05-27 | Disposition: A | Discharge status: Home / Self Care | Payer: Medicare HMO | Source: Ambulatory Visit | Attending: Radiation Oncology | Admitting: Radiation Oncology

## 2022-05-27 DIAGNOSIS — Z191 Hormone sensitive malignancy status: Secondary | ICD-10-CM | POA: Diagnosis not present

## 2022-05-27 DIAGNOSIS — C61 Malignant neoplasm of prostate: Secondary | ICD-10-CM | POA: Insufficient documentation

## 2022-06-01 DIAGNOSIS — C61 Malignant neoplasm of prostate: Secondary | ICD-10-CM | POA: Insufficient documentation

## 2022-06-01 DIAGNOSIS — Z191 Hormone sensitive malignancy status: Secondary | ICD-10-CM | POA: Diagnosis not present

## 2022-06-07 ENCOUNTER — Ambulatory Visit
Admission: RE | Admit: 2022-06-07 | Discharge: 2022-06-07 | Disposition: A | Discharge status: Home / Self Care | Payer: Medicare HMO | Source: Ambulatory Visit | Attending: Radiation Oncology | Admitting: Radiation Oncology

## 2022-06-07 ENCOUNTER — Other Ambulatory Visit: Payer: Self-pay

## 2022-06-07 DIAGNOSIS — C61 Malignant neoplasm of prostate: Secondary | ICD-10-CM | POA: Diagnosis not present

## 2022-06-07 DIAGNOSIS — Z191 Hormone sensitive malignancy status: Secondary | ICD-10-CM | POA: Diagnosis not present

## 2022-06-07 DIAGNOSIS — Z510 Encounter for antineoplastic radiation therapy: Secondary | ICD-10-CM | POA: Diagnosis not present

## 2022-06-07 LAB — RAD ONC ARIA SESSION SUMMARY
Course Elapsed Days: 0
Plan Fractions Treated to Date: 1
Plan Prescribed Dose Per Fraction: 1.8 Gy
Plan Total Fractions Prescribed: 25
Plan Total Prescribed Dose: 45 Gy
Reference Point Dosage Given to Date: 1.8 Gy
Reference Point Session Dosage Given: 1.8 Gy
Session Number: 1

## 2022-06-08 ENCOUNTER — Ambulatory Visit
Admission: RE | Admit: 2022-06-08 | Discharge: 2022-06-08 | Disposition: A | Discharge status: Home / Self Care | Payer: Medicare HMO | Source: Ambulatory Visit | Attending: Radiation Oncology | Admitting: Radiation Oncology

## 2022-06-08 ENCOUNTER — Other Ambulatory Visit: Payer: Self-pay

## 2022-06-08 DIAGNOSIS — Z510 Encounter for antineoplastic radiation therapy: Secondary | ICD-10-CM | POA: Diagnosis not present

## 2022-06-08 DIAGNOSIS — Z191 Hormone sensitive malignancy status: Secondary | ICD-10-CM | POA: Diagnosis not present

## 2022-06-08 DIAGNOSIS — C61 Malignant neoplasm of prostate: Secondary | ICD-10-CM | POA: Diagnosis not present

## 2022-06-08 LAB — RAD ONC ARIA SESSION SUMMARY
Course Elapsed Days: 1
Plan Fractions Treated to Date: 2
Plan Prescribed Dose Per Fraction: 1.8 Gy
Plan Total Fractions Prescribed: 25
Plan Total Prescribed Dose: 45 Gy
Reference Point Dosage Given to Date: 3.6 Gy
Reference Point Session Dosage Given: 1.8 Gy
Session Number: 2

## 2022-06-09 ENCOUNTER — Ambulatory Visit
Admission: RE | Admit: 2022-06-09 | Discharge: 2022-06-09 | Disposition: A | Discharge status: Home / Self Care | Payer: Medicare HMO | Source: Ambulatory Visit | Attending: Radiation Oncology | Admitting: Radiation Oncology

## 2022-06-09 ENCOUNTER — Other Ambulatory Visit: Payer: Self-pay

## 2022-06-09 DIAGNOSIS — Z510 Encounter for antineoplastic radiation therapy: Secondary | ICD-10-CM | POA: Diagnosis not present

## 2022-06-09 DIAGNOSIS — Z191 Hormone sensitive malignancy status: Secondary | ICD-10-CM | POA: Diagnosis not present

## 2022-06-09 DIAGNOSIS — C61 Malignant neoplasm of prostate: Secondary | ICD-10-CM | POA: Diagnosis not present

## 2022-06-09 LAB — RAD ONC ARIA SESSION SUMMARY
Course Elapsed Days: 2
Plan Fractions Treated to Date: 3
Plan Prescribed Dose Per Fraction: 1.8 Gy
Plan Total Fractions Prescribed: 25
Plan Total Prescribed Dose: 45 Gy
Reference Point Dosage Given to Date: 5.4 Gy
Reference Point Session Dosage Given: 1.8 Gy
Session Number: 3

## 2022-06-10 ENCOUNTER — Other Ambulatory Visit: Payer: Self-pay

## 2022-06-10 ENCOUNTER — Ambulatory Visit
Admission: RE | Admit: 2022-06-10 | Discharge: 2022-06-10 | Disposition: A | Discharge status: Home / Self Care | Payer: Medicare HMO | Source: Ambulatory Visit | Attending: Radiation Oncology | Admitting: Radiation Oncology

## 2022-06-10 DIAGNOSIS — Z510 Encounter for antineoplastic radiation therapy: Secondary | ICD-10-CM | POA: Diagnosis not present

## 2022-06-10 DIAGNOSIS — Z191 Hormone sensitive malignancy status: Secondary | ICD-10-CM | POA: Diagnosis not present

## 2022-06-10 DIAGNOSIS — C61 Malignant neoplasm of prostate: Secondary | ICD-10-CM | POA: Diagnosis not present

## 2022-06-10 LAB — RAD ONC ARIA SESSION SUMMARY
Course Elapsed Days: 3
Plan Fractions Treated to Date: 4
Plan Prescribed Dose Per Fraction: 1.8 Gy
Plan Total Fractions Prescribed: 25
Plan Total Prescribed Dose: 45 Gy
Reference Point Dosage Given to Date: 7.2 Gy
Reference Point Session Dosage Given: 1.8 Gy
Session Number: 4

## 2022-06-11 ENCOUNTER — Other Ambulatory Visit: Payer: Self-pay

## 2022-06-11 ENCOUNTER — Ambulatory Visit
Admission: RE | Admit: 2022-06-11 | Discharge: 2022-06-11 | Disposition: A | Discharge status: Home / Self Care | Payer: Medicare HMO | Source: Ambulatory Visit | Attending: Radiation Oncology | Admitting: Radiation Oncology

## 2022-06-11 DIAGNOSIS — Z191 Hormone sensitive malignancy status: Secondary | ICD-10-CM | POA: Diagnosis not present

## 2022-06-11 DIAGNOSIS — C61 Malignant neoplasm of prostate: Secondary | ICD-10-CM | POA: Diagnosis not present

## 2022-06-11 DIAGNOSIS — Z510 Encounter for antineoplastic radiation therapy: Secondary | ICD-10-CM | POA: Diagnosis not present

## 2022-06-11 LAB — RAD ONC ARIA SESSION SUMMARY
Course Elapsed Days: 4
Plan Fractions Treated to Date: 5
Plan Prescribed Dose Per Fraction: 1.8 Gy
Plan Total Fractions Prescribed: 25
Plan Total Prescribed Dose: 45 Gy
Reference Point Dosage Given to Date: 9 Gy
Reference Point Session Dosage Given: 1.8 Gy
Session Number: 5

## 2022-06-14 ENCOUNTER — Other Ambulatory Visit: Payer: Self-pay

## 2022-06-14 ENCOUNTER — Ambulatory Visit
Admission: RE | Admit: 2022-06-14 | Discharge: 2022-06-14 | Disposition: A | Discharge status: Home / Self Care | Payer: Medicare HMO | Source: Ambulatory Visit | Attending: Radiation Oncology | Admitting: Radiation Oncology

## 2022-06-14 DIAGNOSIS — Z510 Encounter for antineoplastic radiation therapy: Secondary | ICD-10-CM | POA: Diagnosis not present

## 2022-06-14 DIAGNOSIS — Z191 Hormone sensitive malignancy status: Secondary | ICD-10-CM | POA: Diagnosis not present

## 2022-06-14 DIAGNOSIS — C61 Malignant neoplasm of prostate: Secondary | ICD-10-CM | POA: Diagnosis not present

## 2022-06-14 LAB — RAD ONC ARIA SESSION SUMMARY
Course Elapsed Days: 7
Plan Fractions Treated to Date: 6
Plan Prescribed Dose Per Fraction: 1.8 Gy
Plan Total Fractions Prescribed: 25
Plan Total Prescribed Dose: 45 Gy
Reference Point Dosage Given to Date: 10.8 Gy
Reference Point Session Dosage Given: 1.8 Gy
Session Number: 6

## 2022-06-15 ENCOUNTER — Ambulatory Visit
Admission: RE | Admit: 2022-06-15 | Discharge: 2022-06-15 | Disposition: A | Discharge status: Home / Self Care | Payer: Medicare HMO | Source: Ambulatory Visit | Attending: Radiation Oncology | Admitting: Radiation Oncology

## 2022-06-15 ENCOUNTER — Other Ambulatory Visit: Payer: Self-pay

## 2022-06-15 DIAGNOSIS — Z510 Encounter for antineoplastic radiation therapy: Secondary | ICD-10-CM | POA: Diagnosis not present

## 2022-06-15 DIAGNOSIS — Z191 Hormone sensitive malignancy status: Secondary | ICD-10-CM | POA: Diagnosis not present

## 2022-06-15 DIAGNOSIS — C61 Malignant neoplasm of prostate: Secondary | ICD-10-CM | POA: Diagnosis not present

## 2022-06-15 LAB — RAD ONC ARIA SESSION SUMMARY
Course Elapsed Days: 8
Plan Fractions Treated to Date: 7
Plan Prescribed Dose Per Fraction: 1.8 Gy
Plan Total Fractions Prescribed: 25
Plan Total Prescribed Dose: 45 Gy
Reference Point Dosage Given to Date: 12.6 Gy
Reference Point Session Dosage Given: 1.8 Gy
Session Number: 7

## 2022-06-16 ENCOUNTER — Ambulatory Visit
Admission: RE | Admit: 2022-06-16 | Discharge: 2022-06-16 | Disposition: A | Discharge status: Home / Self Care | Payer: Medicare HMO | Source: Ambulatory Visit | Attending: Radiation Oncology | Admitting: Radiation Oncology

## 2022-06-16 ENCOUNTER — Other Ambulatory Visit: Payer: Self-pay

## 2022-06-16 DIAGNOSIS — Z510 Encounter for antineoplastic radiation therapy: Secondary | ICD-10-CM | POA: Diagnosis not present

## 2022-06-16 DIAGNOSIS — C61 Malignant neoplasm of prostate: Secondary | ICD-10-CM | POA: Diagnosis not present

## 2022-06-16 DIAGNOSIS — Z191 Hormone sensitive malignancy status: Secondary | ICD-10-CM | POA: Diagnosis not present

## 2022-06-16 LAB — RAD ONC ARIA SESSION SUMMARY
Course Elapsed Days: 9
Plan Fractions Treated to Date: 8
Plan Prescribed Dose Per Fraction: 1.8 Gy
Plan Total Fractions Prescribed: 25
Plan Total Prescribed Dose: 45 Gy
Reference Point Dosage Given to Date: 14.4 Gy
Reference Point Session Dosage Given: 1.8 Gy
Session Number: 8

## 2022-06-17 ENCOUNTER — Other Ambulatory Visit: Payer: Self-pay

## 2022-06-17 ENCOUNTER — Ambulatory Visit
Admission: RE | Admit: 2022-06-17 | Discharge: 2022-06-17 | Disposition: A | Discharge status: Home / Self Care | Payer: Medicare HMO | Source: Ambulatory Visit | Attending: Radiation Oncology | Admitting: Radiation Oncology

## 2022-06-17 DIAGNOSIS — Z510 Encounter for antineoplastic radiation therapy: Secondary | ICD-10-CM | POA: Diagnosis not present

## 2022-06-17 DIAGNOSIS — Z191 Hormone sensitive malignancy status: Secondary | ICD-10-CM | POA: Diagnosis not present

## 2022-06-17 DIAGNOSIS — C61 Malignant neoplasm of prostate: Secondary | ICD-10-CM | POA: Diagnosis not present

## 2022-06-17 LAB — RAD ONC ARIA SESSION SUMMARY
Course Elapsed Days: 10
Plan Fractions Treated to Date: 9
Plan Prescribed Dose Per Fraction: 1.8 Gy
Plan Total Fractions Prescribed: 25
Plan Total Prescribed Dose: 45 Gy
Reference Point Dosage Given to Date: 16.2 Gy
Reference Point Session Dosage Given: 1.8 Gy
Session Number: 9

## 2022-06-18 ENCOUNTER — Ambulatory Visit
Admission: RE | Admit: 2022-06-18 | Discharge: 2022-06-18 | Disposition: A | Discharge status: Home / Self Care | Payer: Medicare HMO | Source: Ambulatory Visit | Attending: Radiation Oncology | Admitting: Radiation Oncology

## 2022-06-18 ENCOUNTER — Other Ambulatory Visit: Payer: Self-pay

## 2022-06-18 DIAGNOSIS — C61 Malignant neoplasm of prostate: Secondary | ICD-10-CM | POA: Diagnosis not present

## 2022-06-18 DIAGNOSIS — Z510 Encounter for antineoplastic radiation therapy: Secondary | ICD-10-CM | POA: Diagnosis not present

## 2022-06-18 DIAGNOSIS — Z191 Hormone sensitive malignancy status: Secondary | ICD-10-CM | POA: Diagnosis not present

## 2022-06-18 LAB — RAD ONC ARIA SESSION SUMMARY
Course Elapsed Days: 11
Plan Fractions Treated to Date: 10
Plan Prescribed Dose Per Fraction: 1.8 Gy
Plan Total Fractions Prescribed: 25
Plan Total Prescribed Dose: 45 Gy
Reference Point Dosage Given to Date: 18 Gy
Reference Point Session Dosage Given: 1.8 Gy
Session Number: 10

## 2022-06-22 ENCOUNTER — Ambulatory Visit
Admission: RE | Admit: 2022-06-22 | Discharge: 2022-06-22 | Disposition: A | Discharge status: Home / Self Care | Payer: Medicare HMO | Source: Ambulatory Visit | Attending: Radiation Oncology | Admitting: Radiation Oncology

## 2022-06-22 ENCOUNTER — Other Ambulatory Visit: Payer: Self-pay

## 2022-06-22 DIAGNOSIS — C61 Malignant neoplasm of prostate: Secondary | ICD-10-CM | POA: Diagnosis not present

## 2022-06-22 DIAGNOSIS — Z191 Hormone sensitive malignancy status: Secondary | ICD-10-CM | POA: Diagnosis not present

## 2022-06-22 DIAGNOSIS — Z510 Encounter for antineoplastic radiation therapy: Secondary | ICD-10-CM | POA: Diagnosis not present

## 2022-06-22 LAB — RAD ONC ARIA SESSION SUMMARY
Course Elapsed Days: 15
Plan Fractions Treated to Date: 11
Plan Prescribed Dose Per Fraction: 1.8 Gy
Plan Total Fractions Prescribed: 25
Plan Total Prescribed Dose: 45 Gy
Reference Point Dosage Given to Date: 19.8 Gy
Reference Point Session Dosage Given: 1.8 Gy
Session Number: 11

## 2022-06-23 ENCOUNTER — Other Ambulatory Visit: Payer: Self-pay

## 2022-06-23 ENCOUNTER — Ambulatory Visit
Admission: RE | Admit: 2022-06-23 | Discharge: 2022-06-23 | Disposition: A | Discharge status: Home / Self Care | Payer: Medicare HMO | Source: Ambulatory Visit | Attending: Radiation Oncology | Admitting: Radiation Oncology

## 2022-06-23 DIAGNOSIS — Z191 Hormone sensitive malignancy status: Secondary | ICD-10-CM | POA: Diagnosis not present

## 2022-06-23 DIAGNOSIS — Z510 Encounter for antineoplastic radiation therapy: Secondary | ICD-10-CM | POA: Diagnosis not present

## 2022-06-23 DIAGNOSIS — C61 Malignant neoplasm of prostate: Secondary | ICD-10-CM | POA: Diagnosis not present

## 2022-06-23 LAB — RAD ONC ARIA SESSION SUMMARY
Course Elapsed Days: 16
Plan Fractions Treated to Date: 12
Plan Prescribed Dose Per Fraction: 1.8 Gy
Plan Total Fractions Prescribed: 25
Plan Total Prescribed Dose: 45 Gy
Reference Point Dosage Given to Date: 21.6 Gy
Reference Point Session Dosage Given: 1.8 Gy
Session Number: 12

## 2022-06-24 ENCOUNTER — Ambulatory Visit
Admission: RE | Admit: 2022-06-24 | Discharge: 2022-06-24 | Disposition: A | Discharge status: Home / Self Care | Payer: Medicare HMO | Source: Ambulatory Visit | Attending: Radiation Oncology | Admitting: Radiation Oncology

## 2022-06-24 ENCOUNTER — Other Ambulatory Visit: Payer: Self-pay

## 2022-06-24 DIAGNOSIS — Z510 Encounter for antineoplastic radiation therapy: Secondary | ICD-10-CM | POA: Diagnosis not present

## 2022-06-24 DIAGNOSIS — C61 Malignant neoplasm of prostate: Secondary | ICD-10-CM | POA: Diagnosis not present

## 2022-06-24 DIAGNOSIS — Z191 Hormone sensitive malignancy status: Secondary | ICD-10-CM | POA: Diagnosis not present

## 2022-06-24 LAB — RAD ONC ARIA SESSION SUMMARY
Course Elapsed Days: 17
Plan Fractions Treated to Date: 13
Plan Prescribed Dose Per Fraction: 1.8 Gy
Plan Total Fractions Prescribed: 25
Plan Total Prescribed Dose: 45 Gy
Reference Point Dosage Given to Date: 23.4 Gy
Reference Point Session Dosage Given: 1.8 Gy
Session Number: 13

## 2022-06-25 ENCOUNTER — Ambulatory Visit
Admission: RE | Admit: 2022-06-25 | Discharge: 2022-06-25 | Disposition: A | Discharge status: Home / Self Care | Payer: Medicare HMO | Source: Ambulatory Visit | Attending: Radiation Oncology | Admitting: Radiation Oncology

## 2022-06-25 ENCOUNTER — Other Ambulatory Visit: Payer: Self-pay

## 2022-06-25 DIAGNOSIS — C61 Malignant neoplasm of prostate: Secondary | ICD-10-CM | POA: Diagnosis not present

## 2022-06-25 DIAGNOSIS — Z510 Encounter for antineoplastic radiation therapy: Secondary | ICD-10-CM | POA: Diagnosis not present

## 2022-06-25 DIAGNOSIS — Z191 Hormone sensitive malignancy status: Secondary | ICD-10-CM | POA: Diagnosis not present

## 2022-06-25 LAB — RAD ONC ARIA SESSION SUMMARY
Course Elapsed Days: 18
Plan Fractions Treated to Date: 14
Plan Prescribed Dose Per Fraction: 1.8 Gy
Plan Total Fractions Prescribed: 25
Plan Total Prescribed Dose: 45 Gy
Reference Point Dosage Given to Date: 25.2 Gy
Reference Point Session Dosage Given: 1.8 Gy
Session Number: 14

## 2022-06-29 ENCOUNTER — Other Ambulatory Visit: Payer: Self-pay

## 2022-06-29 ENCOUNTER — Ambulatory Visit
Admission: RE | Admit: 2022-06-29 | Discharge: 2022-06-29 | Disposition: A | Discharge status: Home / Self Care | Payer: Medicare HMO | Source: Ambulatory Visit | Attending: Radiation Oncology | Admitting: Radiation Oncology

## 2022-06-29 DIAGNOSIS — Z510 Encounter for antineoplastic radiation therapy: Secondary | ICD-10-CM | POA: Diagnosis not present

## 2022-06-29 DIAGNOSIS — Z191 Hormone sensitive malignancy status: Secondary | ICD-10-CM | POA: Diagnosis not present

## 2022-06-29 DIAGNOSIS — C61 Malignant neoplasm of prostate: Secondary | ICD-10-CM | POA: Insufficient documentation

## 2022-06-29 LAB — RAD ONC ARIA SESSION SUMMARY
Course Elapsed Days: 22
Plan Fractions Treated to Date: 15
Plan Prescribed Dose Per Fraction: 1.8 Gy
Plan Total Fractions Prescribed: 25
Plan Total Prescribed Dose: 45 Gy
Reference Point Dosage Given to Date: 27 Gy
Reference Point Session Dosage Given: 1.8 Gy
Session Number: 15

## 2022-06-30 ENCOUNTER — Ambulatory Visit
Admission: RE | Admit: 2022-06-30 | Discharge: 2022-06-30 | Disposition: A | Discharge status: Home / Self Care | Payer: Medicare HMO | Source: Ambulatory Visit | Attending: Radiation Oncology | Admitting: Radiation Oncology

## 2022-06-30 ENCOUNTER — Other Ambulatory Visit: Payer: Self-pay

## 2022-06-30 DIAGNOSIS — C61 Malignant neoplasm of prostate: Secondary | ICD-10-CM | POA: Diagnosis not present

## 2022-06-30 DIAGNOSIS — Z510 Encounter for antineoplastic radiation therapy: Secondary | ICD-10-CM | POA: Diagnosis not present

## 2022-06-30 DIAGNOSIS — Z191 Hormone sensitive malignancy status: Secondary | ICD-10-CM | POA: Diagnosis not present

## 2022-06-30 LAB — RAD ONC ARIA SESSION SUMMARY
Course Elapsed Days: 23
Plan Fractions Treated to Date: 16
Plan Prescribed Dose Per Fraction: 1.8 Gy
Plan Total Fractions Prescribed: 25
Plan Total Prescribed Dose: 45 Gy
Reference Point Dosage Given to Date: 28.8 Gy
Reference Point Session Dosage Given: 1.8 Gy
Session Number: 16

## 2022-07-01 ENCOUNTER — Ambulatory Visit
Admission: RE | Admit: 2022-07-01 | Discharge: 2022-07-01 | Disposition: A | Discharge status: Home / Self Care | Payer: Medicare HMO | Source: Ambulatory Visit | Attending: Radiation Oncology | Admitting: Radiation Oncology

## 2022-07-01 ENCOUNTER — Other Ambulatory Visit: Payer: Self-pay

## 2022-07-01 DIAGNOSIS — Z191 Hormone sensitive malignancy status: Secondary | ICD-10-CM | POA: Diagnosis not present

## 2022-07-01 DIAGNOSIS — C61 Malignant neoplasm of prostate: Secondary | ICD-10-CM | POA: Diagnosis not present

## 2022-07-01 DIAGNOSIS — Z510 Encounter for antineoplastic radiation therapy: Secondary | ICD-10-CM | POA: Diagnosis not present

## 2022-07-01 LAB — RAD ONC ARIA SESSION SUMMARY
Course Elapsed Days: 24
Plan Fractions Treated to Date: 17
Plan Prescribed Dose Per Fraction: 1.8 Gy
Plan Total Fractions Prescribed: 25
Plan Total Prescribed Dose: 45 Gy
Reference Point Dosage Given to Date: 30.6 Gy
Reference Point Session Dosage Given: 1.8 Gy
Session Number: 17

## 2022-07-02 ENCOUNTER — Other Ambulatory Visit: Payer: Self-pay | Admitting: Radiation Oncology

## 2022-07-02 ENCOUNTER — Other Ambulatory Visit: Payer: Self-pay

## 2022-07-02 ENCOUNTER — Ambulatory Visit
Admission: RE | Admit: 2022-07-02 | Discharge: 2022-07-02 | Disposition: A | Discharge status: Home / Self Care | Payer: Medicare HMO | Source: Ambulatory Visit | Attending: Radiation Oncology | Admitting: Radiation Oncology

## 2022-07-02 DIAGNOSIS — Z510 Encounter for antineoplastic radiation therapy: Secondary | ICD-10-CM | POA: Diagnosis not present

## 2022-07-02 DIAGNOSIS — C61 Malignant neoplasm of prostate: Secondary | ICD-10-CM | POA: Diagnosis not present

## 2022-07-02 DIAGNOSIS — Z191 Hormone sensitive malignancy status: Secondary | ICD-10-CM | POA: Diagnosis not present

## 2022-07-02 LAB — RAD ONC ARIA SESSION SUMMARY
Course Elapsed Days: 25
Plan Fractions Treated to Date: 18
Plan Prescribed Dose Per Fraction: 1.8 Gy
Plan Total Fractions Prescribed: 25
Plan Total Prescribed Dose: 45 Gy
Reference Point Dosage Given to Date: 32.4 Gy
Reference Point Session Dosage Given: 1.8 Gy
Session Number: 18

## 2022-07-02 MED ORDER — TAMSULOSIN HCL 0.4 MG PO CAPS: 0.4000 mg | ORAL_CAPSULE | Freq: Every day | ORAL | 5 refills | Status: DC

## 2022-07-02 NOTE — Progress Notes (Signed)
RN left voicemail for call back to assess any additional needs since starting treatment.  Plan of care in progress.

## 2022-07-05 ENCOUNTER — Other Ambulatory Visit: Payer: Self-pay

## 2022-07-05 ENCOUNTER — Ambulatory Visit
Admission: RE | Admit: 2022-07-05 | Discharge: 2022-07-05 | Disposition: A | Discharge status: Home / Self Care | Payer: Medicare HMO | Source: Ambulatory Visit | Attending: Radiation Oncology | Admitting: Radiation Oncology

## 2022-07-05 DIAGNOSIS — C61 Malignant neoplasm of prostate: Secondary | ICD-10-CM | POA: Diagnosis not present

## 2022-07-05 DIAGNOSIS — Z191 Hormone sensitive malignancy status: Secondary | ICD-10-CM | POA: Diagnosis not present

## 2022-07-05 DIAGNOSIS — Z510 Encounter for antineoplastic radiation therapy: Secondary | ICD-10-CM | POA: Diagnosis not present

## 2022-07-05 LAB — RAD ONC ARIA SESSION SUMMARY
Course Elapsed Days: 28
Plan Fractions Treated to Date: 19
Plan Prescribed Dose Per Fraction: 1.8 Gy
Plan Total Fractions Prescribed: 25
Plan Total Prescribed Dose: 45 Gy
Reference Point Dosage Given to Date: 34.2 Gy
Reference Point Session Dosage Given: 1.8 Gy
Session Number: 19

## 2022-07-06 ENCOUNTER — Other Ambulatory Visit: Payer: Self-pay

## 2022-07-06 ENCOUNTER — Ambulatory Visit
Admission: RE | Admit: 2022-07-06 | Discharge: 2022-07-06 | Disposition: A | Discharge status: Home / Self Care | Payer: Medicare HMO | Source: Ambulatory Visit | Attending: Radiation Oncology | Admitting: Radiation Oncology

## 2022-07-06 DIAGNOSIS — Z191 Hormone sensitive malignancy status: Secondary | ICD-10-CM | POA: Diagnosis not present

## 2022-07-06 DIAGNOSIS — Z510 Encounter for antineoplastic radiation therapy: Secondary | ICD-10-CM | POA: Diagnosis not present

## 2022-07-06 DIAGNOSIS — C61 Malignant neoplasm of prostate: Secondary | ICD-10-CM | POA: Diagnosis not present

## 2022-07-06 LAB — RAD ONC ARIA SESSION SUMMARY
Course Elapsed Days: 29
Plan Fractions Treated to Date: 20
Plan Prescribed Dose Per Fraction: 1.8 Gy
Plan Total Fractions Prescribed: 25
Plan Total Prescribed Dose: 45 Gy
Reference Point Dosage Given to Date: 36 Gy
Reference Point Session Dosage Given: 1.8 Gy
Session Number: 20

## 2022-07-07 ENCOUNTER — Other Ambulatory Visit: Payer: Self-pay

## 2022-07-07 ENCOUNTER — Ambulatory Visit
Admission: RE | Admit: 2022-07-07 | Discharge: 2022-07-07 | Disposition: A | Discharge status: Home / Self Care | Payer: Medicare HMO | Source: Ambulatory Visit | Attending: Radiation Oncology | Admitting: Radiation Oncology

## 2022-07-07 DIAGNOSIS — C61 Malignant neoplasm of prostate: Secondary | ICD-10-CM | POA: Diagnosis not present

## 2022-07-07 DIAGNOSIS — Z510 Encounter for antineoplastic radiation therapy: Secondary | ICD-10-CM | POA: Diagnosis not present

## 2022-07-07 DIAGNOSIS — Z191 Hormone sensitive malignancy status: Secondary | ICD-10-CM | POA: Diagnosis not present

## 2022-07-07 LAB — RAD ONC ARIA SESSION SUMMARY
Course Elapsed Days: 30
Plan Fractions Treated to Date: 21
Plan Prescribed Dose Per Fraction: 1.8 Gy
Plan Total Fractions Prescribed: 25
Plan Total Prescribed Dose: 45 Gy
Reference Point Dosage Given to Date: 37.8 Gy
Reference Point Session Dosage Given: 1.8 Gy
Session Number: 21

## 2022-07-08 ENCOUNTER — Ambulatory Visit
Admission: RE | Admit: 2022-07-08 | Discharge: 2022-07-08 | Disposition: A | Discharge status: Home / Self Care | Payer: Medicare HMO | Source: Ambulatory Visit | Attending: Radiation Oncology | Admitting: Radiation Oncology

## 2022-07-08 ENCOUNTER — Other Ambulatory Visit: Payer: Self-pay

## 2022-07-08 DIAGNOSIS — C61 Malignant neoplasm of prostate: Secondary | ICD-10-CM | POA: Diagnosis not present

## 2022-07-08 DIAGNOSIS — Z191 Hormone sensitive malignancy status: Secondary | ICD-10-CM | POA: Diagnosis not present

## 2022-07-08 DIAGNOSIS — Z510 Encounter for antineoplastic radiation therapy: Secondary | ICD-10-CM | POA: Diagnosis not present

## 2022-07-08 LAB — RAD ONC ARIA SESSION SUMMARY
Course Elapsed Days: 31
Plan Fractions Treated to Date: 22
Plan Prescribed Dose Per Fraction: 1.8 Gy
Plan Total Fractions Prescribed: 25
Plan Total Prescribed Dose: 45 Gy
Reference Point Dosage Given to Date: 39.6 Gy
Reference Point Session Dosage Given: 1.8 Gy
Session Number: 22

## 2022-07-09 ENCOUNTER — Other Ambulatory Visit: Payer: Self-pay

## 2022-07-09 ENCOUNTER — Ambulatory Visit
Admission: RE | Admit: 2022-07-09 | Discharge: 2022-07-09 | Disposition: A | Discharge status: Home / Self Care | Payer: Medicare HMO | Source: Ambulatory Visit | Attending: Radiation Oncology | Admitting: Radiation Oncology

## 2022-07-09 DIAGNOSIS — Z191 Hormone sensitive malignancy status: Secondary | ICD-10-CM | POA: Diagnosis not present

## 2022-07-09 DIAGNOSIS — Z510 Encounter for antineoplastic radiation therapy: Secondary | ICD-10-CM | POA: Diagnosis not present

## 2022-07-09 DIAGNOSIS — C61 Malignant neoplasm of prostate: Secondary | ICD-10-CM | POA: Diagnosis not present

## 2022-07-09 LAB — RAD ONC ARIA SESSION SUMMARY
Course Elapsed Days: 32
Plan Fractions Treated to Date: 23
Plan Prescribed Dose Per Fraction: 1.8 Gy
Plan Total Fractions Prescribed: 25
Plan Total Prescribed Dose: 45 Gy
Reference Point Dosage Given to Date: 41.4 Gy
Reference Point Session Dosage Given: 1.8 Gy
Session Number: 23

## 2022-07-12 ENCOUNTER — Other Ambulatory Visit: Payer: Self-pay

## 2022-07-12 ENCOUNTER — Ambulatory Visit
Admission: RE | Admit: 2022-07-12 | Discharge: 2022-07-12 | Disposition: A | Discharge status: Home / Self Care | Payer: Medicare HMO | Source: Ambulatory Visit | Attending: Radiation Oncology | Admitting: Radiation Oncology

## 2022-07-12 DIAGNOSIS — C61 Malignant neoplasm of prostate: Secondary | ICD-10-CM | POA: Diagnosis not present

## 2022-07-12 DIAGNOSIS — Z191 Hormone sensitive malignancy status: Secondary | ICD-10-CM | POA: Diagnosis not present

## 2022-07-12 DIAGNOSIS — Z510 Encounter for antineoplastic radiation therapy: Secondary | ICD-10-CM | POA: Diagnosis not present

## 2022-07-12 LAB — RAD ONC ARIA SESSION SUMMARY
Course Elapsed Days: 35
Plan Fractions Treated to Date: 24
Plan Prescribed Dose Per Fraction: 1.8 Gy
Plan Total Fractions Prescribed: 25
Plan Total Prescribed Dose: 45 Gy
Reference Point Dosage Given to Date: 43.2 Gy
Reference Point Session Dosage Given: 1.8 Gy
Session Number: 24

## 2022-07-13 ENCOUNTER — Ambulatory Visit: Payer: Medicare HMO

## 2022-07-14 ENCOUNTER — Other Ambulatory Visit: Payer: Self-pay

## 2022-07-14 ENCOUNTER — Ambulatory Visit: Payer: Medicare HMO

## 2022-07-14 DIAGNOSIS — Z510 Encounter for antineoplastic radiation therapy: Secondary | ICD-10-CM | POA: Diagnosis not present

## 2022-07-14 DIAGNOSIS — C61 Malignant neoplasm of prostate: Secondary | ICD-10-CM | POA: Diagnosis not present

## 2022-07-14 DIAGNOSIS — Z191 Hormone sensitive malignancy status: Secondary | ICD-10-CM | POA: Diagnosis not present

## 2022-07-14 LAB — RAD ONC ARIA SESSION SUMMARY
Course Elapsed Days: 37
Plan Fractions Treated to Date: 25
Plan Prescribed Dose Per Fraction: 1.8 Gy
Plan Total Fractions Prescribed: 25
Plan Total Prescribed Dose: 45 Gy
Reference Point Dosage Given to Date: 45 Gy
Reference Point Session Dosage Given: 1.8 Gy
Session Number: 25

## 2022-07-15 ENCOUNTER — Other Ambulatory Visit: Payer: Self-pay

## 2022-07-15 ENCOUNTER — Ambulatory Visit: Payer: Medicare HMO

## 2022-07-15 DIAGNOSIS — Z191 Hormone sensitive malignancy status: Secondary | ICD-10-CM | POA: Diagnosis not present

## 2022-07-15 DIAGNOSIS — C61 Malignant neoplasm of prostate: Secondary | ICD-10-CM | POA: Diagnosis not present

## 2022-07-15 DIAGNOSIS — Z510 Encounter for antineoplastic radiation therapy: Secondary | ICD-10-CM | POA: Diagnosis not present

## 2022-07-15 LAB — RAD ONC ARIA SESSION SUMMARY
Course Elapsed Days: 38
Plan Fractions Treated to Date: 1
Plan Prescribed Dose Per Fraction: 2 Gy
Plan Total Fractions Prescribed: 15
Plan Total Prescribed Dose: 30 Gy
Reference Point Dosage Given to Date: 2 Gy
Reference Point Session Dosage Given: 2 Gy
Session Number: 26

## 2022-07-16 ENCOUNTER — Ambulatory Visit
Admission: RE | Admit: 2022-07-16 | Discharge: 2022-07-16 | Disposition: A | Discharge status: Home / Self Care | Payer: Medicare HMO | Source: Ambulatory Visit | Attending: Radiation Oncology | Admitting: Radiation Oncology

## 2022-07-16 ENCOUNTER — Other Ambulatory Visit: Payer: Self-pay

## 2022-07-16 DIAGNOSIS — C61 Malignant neoplasm of prostate: Secondary | ICD-10-CM | POA: Diagnosis not present

## 2022-07-16 DIAGNOSIS — Z191 Hormone sensitive malignancy status: Secondary | ICD-10-CM | POA: Diagnosis not present

## 2022-07-16 DIAGNOSIS — Z510 Encounter for antineoplastic radiation therapy: Secondary | ICD-10-CM | POA: Diagnosis not present

## 2022-07-16 LAB — RAD ONC ARIA SESSION SUMMARY
Course Elapsed Days: 39
Plan Fractions Treated to Date: 2
Plan Prescribed Dose Per Fraction: 2 Gy
Plan Total Fractions Prescribed: 15
Plan Total Prescribed Dose: 30 Gy
Reference Point Dosage Given to Date: 4 Gy
Reference Point Session Dosage Given: 2 Gy
Session Number: 27

## 2022-07-19 ENCOUNTER — Ambulatory Visit
Admission: RE | Admit: 2022-07-19 | Discharge: 2022-07-19 | Disposition: A | Discharge status: Home / Self Care | Payer: Medicare HMO | Source: Ambulatory Visit | Attending: Radiation Oncology | Admitting: Radiation Oncology

## 2022-07-19 ENCOUNTER — Other Ambulatory Visit: Payer: Self-pay

## 2022-07-19 DIAGNOSIS — C61 Malignant neoplasm of prostate: Secondary | ICD-10-CM | POA: Diagnosis not present

## 2022-07-19 DIAGNOSIS — Z191 Hormone sensitive malignancy status: Secondary | ICD-10-CM | POA: Diagnosis not present

## 2022-07-19 DIAGNOSIS — Z510 Encounter for antineoplastic radiation therapy: Secondary | ICD-10-CM | POA: Diagnosis not present

## 2022-07-19 LAB — RAD ONC ARIA SESSION SUMMARY
Course Elapsed Days: 42
Plan Fractions Treated to Date: 3
Plan Prescribed Dose Per Fraction: 2 Gy
Plan Total Fractions Prescribed: 15
Plan Total Prescribed Dose: 30 Gy
Reference Point Dosage Given to Date: 6 Gy
Reference Point Session Dosage Given: 2 Gy
Session Number: 28

## 2022-07-20 ENCOUNTER — Other Ambulatory Visit: Payer: Self-pay

## 2022-07-20 ENCOUNTER — Ambulatory Visit
Admission: RE | Admit: 2022-07-20 | Discharge: 2022-07-20 | Disposition: A | Discharge status: Home / Self Care | Payer: Medicare HMO | Source: Ambulatory Visit | Attending: Radiation Oncology | Admitting: Radiation Oncology

## 2022-07-20 DIAGNOSIS — Z191 Hormone sensitive malignancy status: Secondary | ICD-10-CM | POA: Diagnosis not present

## 2022-07-20 DIAGNOSIS — C61 Malignant neoplasm of prostate: Secondary | ICD-10-CM | POA: Diagnosis not present

## 2022-07-20 DIAGNOSIS — Z510 Encounter for antineoplastic radiation therapy: Secondary | ICD-10-CM | POA: Diagnosis not present

## 2022-07-20 LAB — RAD ONC ARIA SESSION SUMMARY
Course Elapsed Days: 43
Plan Fractions Treated to Date: 4
Plan Prescribed Dose Per Fraction: 2 Gy
Plan Total Fractions Prescribed: 15
Plan Total Prescribed Dose: 30 Gy
Reference Point Dosage Given to Date: 8 Gy
Reference Point Session Dosage Given: 2 Gy
Session Number: 29

## 2022-07-21 ENCOUNTER — Other Ambulatory Visit: Payer: Self-pay

## 2022-07-21 ENCOUNTER — Ambulatory Visit
Admission: RE | Admit: 2022-07-21 | Discharge: 2022-07-21 | Disposition: A | Discharge status: Home / Self Care | Payer: Medicare HMO | Source: Ambulatory Visit | Attending: Radiation Oncology | Admitting: Radiation Oncology

## 2022-07-21 DIAGNOSIS — Z510 Encounter for antineoplastic radiation therapy: Secondary | ICD-10-CM | POA: Diagnosis not present

## 2022-07-21 DIAGNOSIS — Z191 Hormone sensitive malignancy status: Secondary | ICD-10-CM | POA: Diagnosis not present

## 2022-07-21 DIAGNOSIS — C61 Malignant neoplasm of prostate: Secondary | ICD-10-CM | POA: Diagnosis not present

## 2022-07-21 LAB — RAD ONC ARIA SESSION SUMMARY
Course Elapsed Days: 44
Plan Fractions Treated to Date: 5
Plan Prescribed Dose Per Fraction: 2 Gy
Plan Total Fractions Prescribed: 15
Plan Total Prescribed Dose: 30 Gy
Reference Point Dosage Given to Date: 10 Gy
Reference Point Session Dosage Given: 2 Gy
Session Number: 30

## 2022-07-22 ENCOUNTER — Ambulatory Visit
Admission: RE | Admit: 2022-07-22 | Discharge: 2022-07-22 | Disposition: A | Discharge status: Home / Self Care | Payer: Medicare HMO | Source: Ambulatory Visit | Attending: Radiation Oncology | Admitting: Radiation Oncology

## 2022-07-22 ENCOUNTER — Other Ambulatory Visit: Payer: Self-pay

## 2022-07-22 DIAGNOSIS — Z510 Encounter for antineoplastic radiation therapy: Secondary | ICD-10-CM | POA: Diagnosis not present

## 2022-07-22 DIAGNOSIS — Z191 Hormone sensitive malignancy status: Secondary | ICD-10-CM | POA: Diagnosis not present

## 2022-07-22 DIAGNOSIS — C61 Malignant neoplasm of prostate: Secondary | ICD-10-CM | POA: Diagnosis not present

## 2022-07-22 LAB — RAD ONC ARIA SESSION SUMMARY
Course Elapsed Days: 45
Plan Fractions Treated to Date: 6
Plan Prescribed Dose Per Fraction: 2 Gy
Plan Total Fractions Prescribed: 15
Plan Total Prescribed Dose: 30 Gy
Reference Point Dosage Given to Date: 12 Gy
Reference Point Session Dosage Given: 2 Gy
Session Number: 31

## 2022-07-23 ENCOUNTER — Other Ambulatory Visit: Payer: Self-pay

## 2022-07-23 ENCOUNTER — Ambulatory Visit
Admission: RE | Admit: 2022-07-23 | Discharge: 2022-07-23 | Disposition: A | Discharge status: Home / Self Care | Payer: Medicare HMO | Source: Ambulatory Visit | Attending: Radiation Oncology | Admitting: Radiation Oncology

## 2022-07-23 ENCOUNTER — Encounter: Payer: Self-pay | Admitting: Genetic Counselor

## 2022-07-23 DIAGNOSIS — C61 Malignant neoplasm of prostate: Secondary | ICD-10-CM | POA: Diagnosis not present

## 2022-07-23 DIAGNOSIS — Z191 Hormone sensitive malignancy status: Secondary | ICD-10-CM | POA: Diagnosis not present

## 2022-07-23 DIAGNOSIS — Z510 Encounter for antineoplastic radiation therapy: Secondary | ICD-10-CM | POA: Diagnosis not present

## 2022-07-23 LAB — RAD ONC ARIA SESSION SUMMARY
Course Elapsed Days: 46
Plan Fractions Treated to Date: 7
Plan Prescribed Dose Per Fraction: 2 Gy
Plan Total Fractions Prescribed: 15
Plan Total Prescribed Dose: 30 Gy
Reference Point Dosage Given to Date: 14 Gy
Reference Point Session Dosage Given: 2 Gy
Session Number: 32

## 2022-07-26 ENCOUNTER — Other Ambulatory Visit: Payer: Self-pay

## 2022-07-26 ENCOUNTER — Ambulatory Visit
Admission: RE | Admit: 2022-07-26 | Discharge: 2022-07-26 | Disposition: A | Discharge status: Home / Self Care | Payer: Medicare HMO | Source: Ambulatory Visit | Attending: Radiation Oncology | Admitting: Radiation Oncology

## 2022-07-26 DIAGNOSIS — Z510 Encounter for antineoplastic radiation therapy: Secondary | ICD-10-CM | POA: Diagnosis not present

## 2022-07-26 DIAGNOSIS — Z191 Hormone sensitive malignancy status: Secondary | ICD-10-CM | POA: Diagnosis not present

## 2022-07-26 DIAGNOSIS — C61 Malignant neoplasm of prostate: Secondary | ICD-10-CM | POA: Diagnosis not present

## 2022-07-26 LAB — RAD ONC ARIA SESSION SUMMARY
Course Elapsed Days: 49
Plan Fractions Treated to Date: 8
Plan Prescribed Dose Per Fraction: 2 Gy
Plan Total Fractions Prescribed: 15
Plan Total Prescribed Dose: 30 Gy
Reference Point Dosage Given to Date: 16 Gy
Reference Point Session Dosage Given: 2 Gy
Session Number: 33

## 2022-07-27 ENCOUNTER — Other Ambulatory Visit: Payer: Self-pay

## 2022-07-27 ENCOUNTER — Ambulatory Visit
Admission: RE | Admit: 2022-07-27 | Discharge: 2022-07-27 | Disposition: A | Discharge status: Home / Self Care | Payer: Medicare HMO | Source: Ambulatory Visit | Attending: Radiation Oncology | Admitting: Radiation Oncology

## 2022-07-27 DIAGNOSIS — C61 Malignant neoplasm of prostate: Secondary | ICD-10-CM | POA: Diagnosis not present

## 2022-07-27 DIAGNOSIS — Z510 Encounter for antineoplastic radiation therapy: Secondary | ICD-10-CM | POA: Diagnosis not present

## 2022-07-27 DIAGNOSIS — Z191 Hormone sensitive malignancy status: Secondary | ICD-10-CM | POA: Diagnosis not present

## 2022-07-27 LAB — RAD ONC ARIA SESSION SUMMARY
Course Elapsed Days: 50
Plan Fractions Treated to Date: 9
Plan Prescribed Dose Per Fraction: 2 Gy
Plan Total Fractions Prescribed: 15
Plan Total Prescribed Dose: 30 Gy
Reference Point Dosage Given to Date: 18 Gy
Reference Point Session Dosage Given: 2 Gy
Session Number: 34

## 2022-07-28 ENCOUNTER — Other Ambulatory Visit: Payer: Self-pay

## 2022-07-28 ENCOUNTER — Ambulatory Visit
Admission: RE | Admit: 2022-07-28 | Discharge: 2022-07-28 | Disposition: A | Discharge status: Home / Self Care | Payer: Medicare HMO | Source: Ambulatory Visit | Attending: Radiation Oncology | Admitting: Radiation Oncology

## 2022-07-28 DIAGNOSIS — Z510 Encounter for antineoplastic radiation therapy: Secondary | ICD-10-CM | POA: Diagnosis not present

## 2022-07-28 DIAGNOSIS — Z191 Hormone sensitive malignancy status: Secondary | ICD-10-CM | POA: Diagnosis not present

## 2022-07-28 DIAGNOSIS — C61 Malignant neoplasm of prostate: Secondary | ICD-10-CM | POA: Diagnosis not present

## 2022-07-28 LAB — RAD ONC ARIA SESSION SUMMARY
Course Elapsed Days: 51
Plan Fractions Treated to Date: 10
Plan Prescribed Dose Per Fraction: 2 Gy
Plan Total Fractions Prescribed: 15
Plan Total Prescribed Dose: 30 Gy
Reference Point Dosage Given to Date: 20 Gy
Reference Point Session Dosage Given: 2 Gy
Session Number: 35

## 2022-07-29 ENCOUNTER — Ambulatory Visit: Payer: Medicare HMO

## 2022-07-30 ENCOUNTER — Ambulatory Visit
Admission: RE | Admit: 2022-07-30 | Discharge: 2022-07-30 | Disposition: A | Discharge status: Home / Self Care | Payer: Medicare HMO | Source: Ambulatory Visit | Attending: Radiation Oncology | Admitting: Radiation Oncology

## 2022-07-30 ENCOUNTER — Other Ambulatory Visit: Payer: Self-pay

## 2022-07-30 DIAGNOSIS — Z510 Encounter for antineoplastic radiation therapy: Secondary | ICD-10-CM | POA: Diagnosis not present

## 2022-07-30 DIAGNOSIS — C61 Malignant neoplasm of prostate: Secondary | ICD-10-CM | POA: Diagnosis not present

## 2022-07-30 DIAGNOSIS — Z191 Hormone sensitive malignancy status: Secondary | ICD-10-CM | POA: Diagnosis not present

## 2022-07-30 LAB — RAD ONC ARIA SESSION SUMMARY
Course Elapsed Days: 53
Plan Fractions Treated to Date: 11
Plan Prescribed Dose Per Fraction: 2 Gy
Plan Total Fractions Prescribed: 15
Plan Total Prescribed Dose: 30 Gy
Reference Point Dosage Given to Date: 22 Gy
Reference Point Session Dosage Given: 2 Gy
Session Number: 36

## 2022-08-02 ENCOUNTER — Ambulatory Visit
Admission: RE | Admit: 2022-08-02 | Discharge: 2022-08-02 | Disposition: A | Discharge status: Home / Self Care | Payer: Medicare HMO | Source: Ambulatory Visit | Attending: Radiation Oncology | Admitting: Radiation Oncology

## 2022-08-02 ENCOUNTER — Other Ambulatory Visit: Payer: Self-pay

## 2022-08-02 DIAGNOSIS — Z191 Hormone sensitive malignancy status: Secondary | ICD-10-CM | POA: Diagnosis not present

## 2022-08-02 DIAGNOSIS — C61 Malignant neoplasm of prostate: Secondary | ICD-10-CM | POA: Diagnosis not present

## 2022-08-02 DIAGNOSIS — Z510 Encounter for antineoplastic radiation therapy: Secondary | ICD-10-CM | POA: Diagnosis not present

## 2022-08-02 LAB — RAD ONC ARIA SESSION SUMMARY
Course Elapsed Days: 56
Plan Fractions Treated to Date: 12
Plan Prescribed Dose Per Fraction: 2 Gy
Plan Total Fractions Prescribed: 15
Plan Total Prescribed Dose: 30 Gy
Reference Point Dosage Given to Date: 24 Gy
Reference Point Session Dosage Given: 2 Gy
Session Number: 37

## 2022-08-03 ENCOUNTER — Ambulatory Visit
Admission: RE | Admit: 2022-08-03 | Discharge: 2022-08-03 | Disposition: A | Discharge status: Home / Self Care | Payer: Medicare HMO | Source: Ambulatory Visit | Attending: Radiation Oncology | Admitting: Radiation Oncology

## 2022-08-03 ENCOUNTER — Ambulatory Visit: Payer: Medicare HMO

## 2022-08-03 ENCOUNTER — Other Ambulatory Visit: Payer: Self-pay

## 2022-08-03 DIAGNOSIS — C61 Malignant neoplasm of prostate: Secondary | ICD-10-CM | POA: Diagnosis not present

## 2022-08-03 DIAGNOSIS — Z191 Hormone sensitive malignancy status: Secondary | ICD-10-CM | POA: Diagnosis not present

## 2022-08-03 DIAGNOSIS — Z510 Encounter for antineoplastic radiation therapy: Secondary | ICD-10-CM | POA: Diagnosis not present

## 2022-08-03 LAB — RAD ONC ARIA SESSION SUMMARY
Course Elapsed Days: 57
Plan Fractions Treated to Date: 13
Plan Prescribed Dose Per Fraction: 2 Gy
Plan Total Fractions Prescribed: 15
Plan Total Prescribed Dose: 30 Gy
Reference Point Dosage Given to Date: 26 Gy
Reference Point Session Dosage Given: 2 Gy
Session Number: 38

## 2022-08-04 ENCOUNTER — Ambulatory Visit: Payer: Medicare HMO

## 2022-08-04 ENCOUNTER — Other Ambulatory Visit: Payer: Self-pay

## 2022-08-04 ENCOUNTER — Ambulatory Visit
Admission: RE | Admit: 2022-08-04 | Discharge: 2022-08-04 | Disposition: A | Discharge status: Home / Self Care | Payer: Medicare HMO | Source: Ambulatory Visit | Attending: Radiation Oncology | Admitting: Radiation Oncology

## 2022-08-04 DIAGNOSIS — Z191 Hormone sensitive malignancy status: Secondary | ICD-10-CM | POA: Diagnosis not present

## 2022-08-04 DIAGNOSIS — Z510 Encounter for antineoplastic radiation therapy: Secondary | ICD-10-CM | POA: Diagnosis not present

## 2022-08-04 DIAGNOSIS — C61 Malignant neoplasm of prostate: Secondary | ICD-10-CM | POA: Diagnosis not present

## 2022-08-04 LAB — RAD ONC ARIA SESSION SUMMARY
Course Elapsed Days: 58
Plan Fractions Treated to Date: 14
Plan Prescribed Dose Per Fraction: 2 Gy
Plan Total Fractions Prescribed: 15
Plan Total Prescribed Dose: 30 Gy
Reference Point Dosage Given to Date: 28 Gy
Reference Point Session Dosage Given: 2 Gy
Session Number: 39

## 2022-08-05 ENCOUNTER — Ambulatory Visit
Admission: RE | Admit: 2022-08-05 | Discharge: 2022-08-05 | Disposition: A | Discharge status: Home / Self Care | Payer: Medicare HMO | Source: Ambulatory Visit | Attending: Radiation Oncology | Admitting: Radiation Oncology

## 2022-08-05 ENCOUNTER — Other Ambulatory Visit: Payer: Self-pay

## 2022-08-05 ENCOUNTER — Encounter: Payer: Self-pay | Admitting: Urology

## 2022-08-05 DIAGNOSIS — C61 Malignant neoplasm of prostate: Secondary | ICD-10-CM | POA: Diagnosis not present

## 2022-08-05 DIAGNOSIS — Z191 Hormone sensitive malignancy status: Secondary | ICD-10-CM | POA: Diagnosis not present

## 2022-08-05 DIAGNOSIS — Z510 Encounter for antineoplastic radiation therapy: Secondary | ICD-10-CM | POA: Diagnosis not present

## 2022-08-05 LAB — RAD ONC ARIA SESSION SUMMARY
Course Elapsed Days: 59
Plan Fractions Treated to Date: 15
Plan Prescribed Dose Per Fraction: 2 Gy
Plan Total Fractions Prescribed: 15
Plan Total Prescribed Dose: 30 Gy
Reference Point Dosage Given to Date: 30 Gy
Reference Point Session Dosage Given: 2 Gy
Session Number: 40

## 2022-08-06 DIAGNOSIS — H35373 Puckering of macula, bilateral: Secondary | ICD-10-CM | POA: Diagnosis not present

## 2022-08-06 DIAGNOSIS — H401132 Primary open-angle glaucoma, bilateral, moderate stage: Secondary | ICD-10-CM | POA: Diagnosis not present

## 2022-08-06 DIAGNOSIS — H34812 Central retinal vein occlusion, left eye, with macular edema: Secondary | ICD-10-CM | POA: Diagnosis not present

## 2022-08-06 NOTE — Progress Notes (Signed)
RN spoke with patient after completing his radiation treatment on 2/8 for his stage T2a adenocarcinoma of the prostate with a Gleason's score of 4+4 and a PSA of 18.02.   Patient reports doing well at this time.  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   No further needs at this time.  Encouraged patient to reach out with any questions or concerns that may arise.

## 2022-08-12 DIAGNOSIS — N401 Enlarged prostate with lower urinary tract symptoms: Secondary | ICD-10-CM | POA: Diagnosis not present

## 2022-08-12 DIAGNOSIS — D692 Other nonthrombocytopenic purpura: Secondary | ICD-10-CM | POA: Diagnosis not present

## 2022-08-12 DIAGNOSIS — C181 Malignant neoplasm of appendix: Secondary | ICD-10-CM | POA: Diagnosis not present

## 2022-08-12 DIAGNOSIS — C61 Malignant neoplasm of prostate: Secondary | ICD-10-CM | POA: Diagnosis not present

## 2022-08-12 DIAGNOSIS — I10 Essential (primary) hypertension: Secondary | ICD-10-CM | POA: Diagnosis not present

## 2022-08-12 DIAGNOSIS — R202 Paresthesia of skin: Secondary | ICD-10-CM | POA: Diagnosis not present

## 2022-08-12 DIAGNOSIS — Z23 Encounter for immunization: Secondary | ICD-10-CM | POA: Diagnosis not present

## 2022-08-12 DIAGNOSIS — E7800 Pure hypercholesterolemia, unspecified: Secondary | ICD-10-CM | POA: Diagnosis not present

## 2022-08-12 DIAGNOSIS — F4310 Post-traumatic stress disorder, unspecified: Secondary | ICD-10-CM | POA: Diagnosis not present

## 2022-08-12 DIAGNOSIS — B1920 Unspecified viral hepatitis C without hepatic coma: Secondary | ICD-10-CM | POA: Diagnosis not present

## 2022-08-18 DIAGNOSIS — C61 Malignant neoplasm of prostate: Secondary | ICD-10-CM | POA: Diagnosis not present

## 2022-08-25 DIAGNOSIS — C61 Malignant neoplasm of prostate: Secondary | ICD-10-CM | POA: Diagnosis not present

## 2022-08-25 NOTE — Progress Notes (Signed)
UPDATE: MSH6 p.V215I VUS was amended to Likely Benign.  The amended report date is July 01, 2022.

## 2022-08-26 ENCOUNTER — Other Ambulatory Visit: Payer: Self-pay | Admitting: Urology

## 2022-08-26 DIAGNOSIS — C61 Malignant neoplasm of prostate: Secondary | ICD-10-CM

## 2022-08-26 NOTE — Progress Notes (Signed)
                                                                                                                                                             Patient Name: James Mckinney MRN: [id number] DOB: 30-Sep-1945 Referring Physician: Dutch Gray (Profile Not Attached) Date of Service: 08/05/2022 [facility], [place]                                                        End Of Treatment Note  Diagnoses: C61-Malignant neoplasm of prostate  Cancer Staging: 77 y.o. gentleman with Stage T2a adenocarcinoma of the prostate with a Gleason score of 4+4 and a PSA of 18.02.     Intent: Curative  Radiation Treatment Dates: 06/07/2022 through 08/05/2022 Site Technique Total Dose (Gy) Dose per Fx (Gy) Completed Fx Beam Energies  Prostate: Prostate_Pelvis IMRT 45/45 1.8 25/25 6X  Prostate: Prostate_Bst_IMRT IMRT 30/30 2 15/15 6X   Narrative: The patient tolerated radiation therapy relatively well.  He did experience occasional loose stools/diarrhea.  He also reported increased frequency and urgency which were improved with Flomax, prescribed during treatment.  Plan: The patient will receive a call in about one month from the radiation oncology department. He will continue follow up with Dr. Alinda Money, in urology as well.  ------------------------------------------------   Tyler Pita, MD [place]: [phone]  Fax: [phone] [email or web address]  Skype  [email or web address]

## 2022-09-07 ENCOUNTER — Ambulatory Visit
Admission: RE | Admit: 2022-09-07 | Discharge: 2022-09-07 | Disposition: A | Discharge status: Home / Self Care | Payer: Medicare HMO | Source: Ambulatory Visit | Attending: Radiation Oncology | Admitting: Radiation Oncology

## 2022-09-07 DIAGNOSIS — C61 Malignant neoplasm of prostate: Secondary | ICD-10-CM | POA: Insufficient documentation

## 2022-09-07 NOTE — Progress Notes (Signed)
  Radiation Oncology         [id number]) [phone] ________________________________  Name: James Mckinney MRN: [id number]  Date of Service: 09/07/2022  DOB: 1945/10/20  Post Treatment Telephone Note  Diagnosis:  77 y.o. gentleman with Stage T2a adenocarcinoma of the prostate with a Gleason score of 4+4 and a PSA of 18.02.     Intent: Curative  Radiation Treatment Dates: 06/07/2022 through 08/05/2022 Site Technique Total Dose (Gy) Dose per Fx (Gy) Completed Fx Beam Energies  Prostate: Prostate_Pelvis IMRT 45/45 1.8 25/25 6X  Prostate: Prostate_Bst_IMRT IMRT 30/30 2 15/15 6X   (as documented in provider EOT note)   Pre Treatment IPSS Score: 18 (as documented in the provider consult note)   The patient was not available for call today. Detailed voicemail was left.   Patient has a scheduled follow up visit with his urologist, Dr. Alinda Money, on 09/2022 for ongoing surveillance. He was counseled that PSA levels will be drawn in the urology office, and was reassured that additional time is expected to improve bowel and bladder symptoms. He was encouraged to call back with concerns or questions regarding radiation.    Leandra Kern, LPN

## 2022-09-13 ENCOUNTER — Inpatient Hospital Stay: Payer: Medicare HMO | Admitting: *Deleted

## 2022-09-13 ENCOUNTER — Encounter: Payer: Self-pay | Admitting: *Deleted

## 2022-09-13 ENCOUNTER — Telehealth: Payer: Self-pay | Admitting: *Deleted

## 2022-09-13 DIAGNOSIS — C61 Malignant neoplasm of prostate: Secondary | ICD-10-CM

## 2022-09-13 NOTE — Progress Notes (Signed)
SCP reviewed and completed. 

## 2022-09-13 NOTE — Telephone Encounter (Signed)
Per 3/18 IB reached out to patient to answer any questions regarding appointment. Left voicemail of time and date of appointment.

## 2022-09-22 DIAGNOSIS — H401133 Primary open-angle glaucoma, bilateral, severe stage: Secondary | ICD-10-CM | POA: Diagnosis not present

## 2022-10-21 ENCOUNTER — Telehealth: Payer: Self-pay | Admitting: Oncology

## 2022-10-21 ENCOUNTER — Inpatient Hospital Stay: Payer: Medicare HMO | Admitting: Oncology

## 2022-10-21 ENCOUNTER — Telehealth: Payer: Self-pay | Admitting: *Deleted

## 2022-10-21 ENCOUNTER — Inpatient Hospital Stay: Payer: Medicare HMO

## 2022-10-21 NOTE — Telephone Encounter (Signed)
Called patient per 4/25 secure chat to reschedule missed appointments. Patient rescheduled and notified.

## 2022-10-21 NOTE — Telephone Encounter (Signed)
Mr. Zawislak was "no show" for his lab/OV today. Called and he said he is doing well-just forgot. Scheduling message sent to reschedule for 1-2 months.

## 2022-10-30 IMAGING — MR MR MRA NECK WO/W CM
8 series · 46 of 48 positions shown · IV contrast (Gadavist)
Comparison: None.

CLINICAL DATA: Acute neurologic deficit

EXAM:
MRA NECK WITHOUT AND WITH CONTRAST
TECHNIQUE: Multiplanar and multiecho pulse sequences of the neck were obtained
without and with intravenous contrast. Angiographic images of the
neck were obtained using MRA technique without and with intravenous
contrast.
CONTRAST:  10mL GADAVIST GADOBUTROL 1 MMOL/ML IV SOLN

[Series 15: tof_fl3d_tra_iso · axial · 0.6mm · 0.52mm/px · z∈[-180,-101]mm · 8 of 133 slices shown]
[im 1/133]
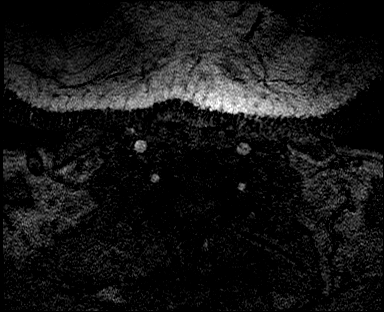
[im 19/133]
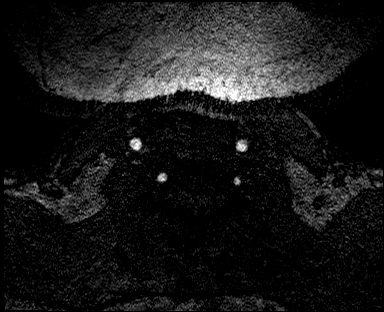
[im 38/133]
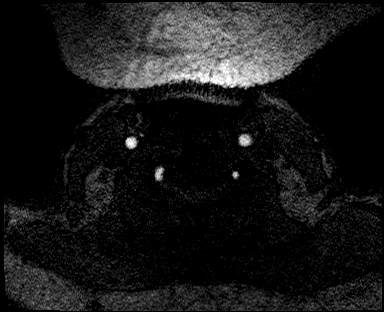
[im 57/133]
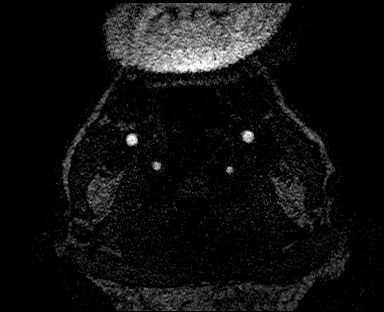
[im 76/133]
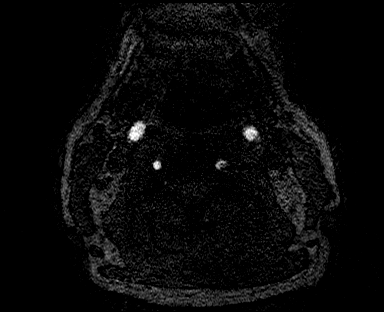
[im 95/133]
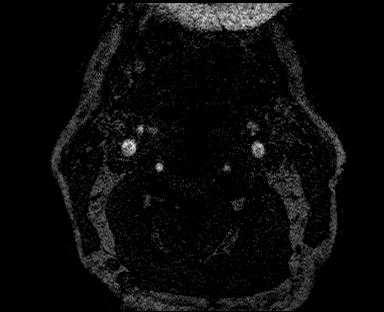
[im 114/133]
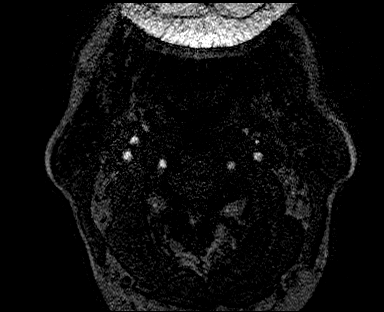
[im 133/133]
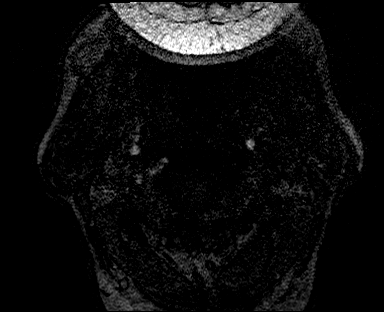

[Series 18: angio_fl3d_cor_pre_ttc=3.0s · coronal · 0.9mm · 0.85mm/px · 5 of 80 slices shown]
[im 1/80]
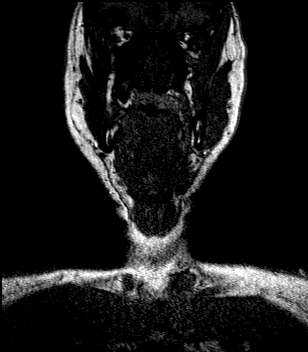
[im 20/80]
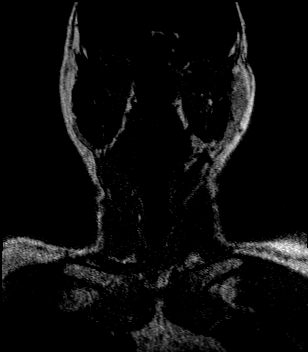
[im 40/80]
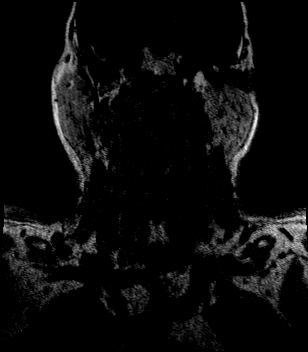
[im 60/80]
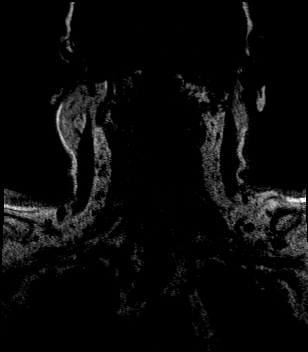
[im 80/80]
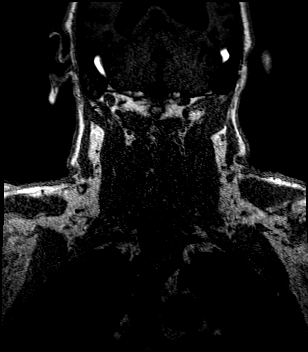

[Series 21: angio_fl3d_cor_post_ttc=3.0s · coronal · 0.9mm · 0.85mm/px · 6 of 79 slices shown (1 of 2)]
[im 1/79]
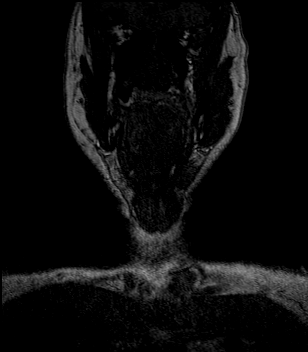
[im 16/79]
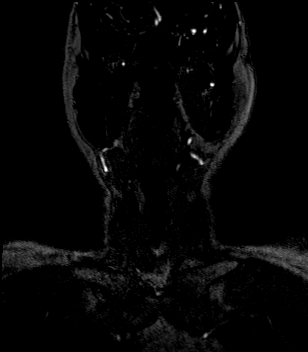
[im 32/79]
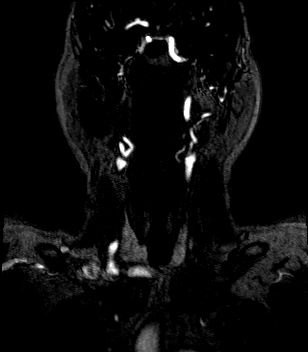
[im 47/79]
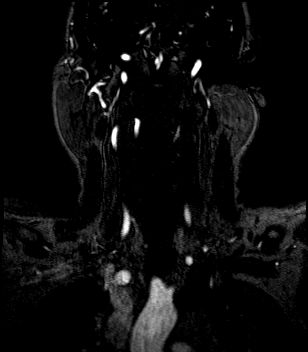
[im 63/79]
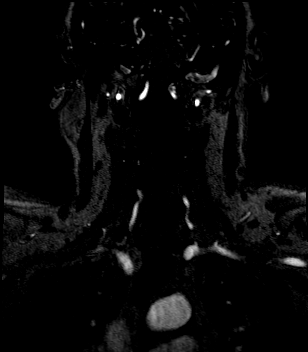
[im 79/79]
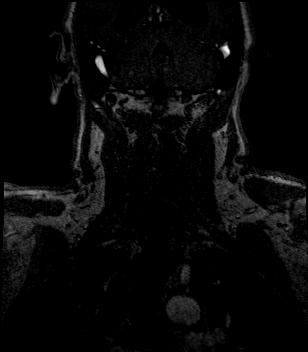

[Series 22: angio_fl3d_cor_post_ttc=3.0s_moco-adv · coronal · 0.9mm · 0.85mm/px · 6 of 79 slices shown (1 of 2)]
[im 1/79]
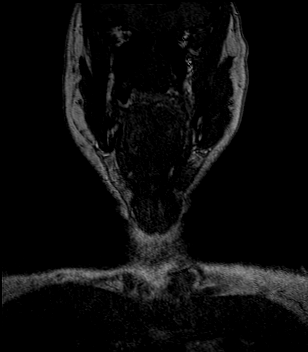
[im 16/79]
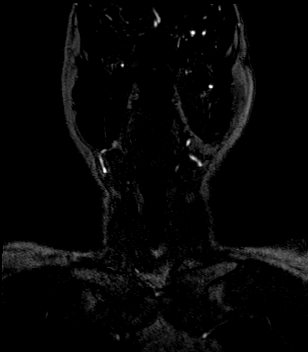
[im 32/79]
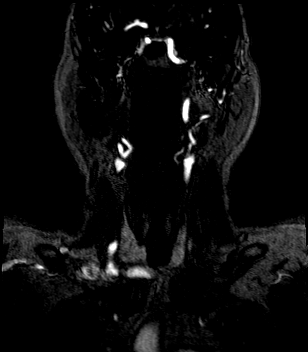
[im 47/79]
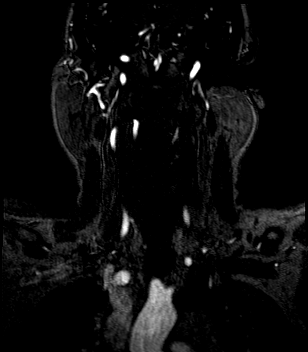
[im 63/79]
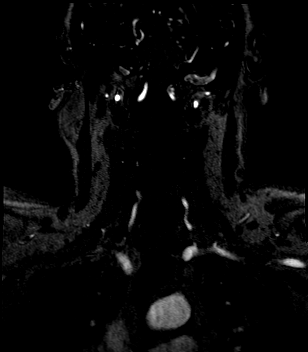
[im 79/79]
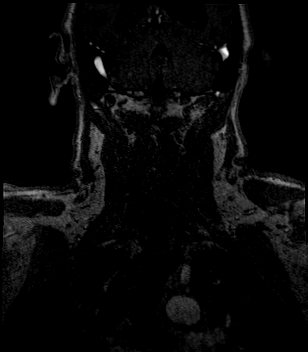

[Series 23: angio_fl3d_cor_post_ttc=3.0s_moco-adv_sub · coronal · 0.9mm · 0.85mm/px · 5 of 78 slices shown (1 of 2)]
[im 1/78]
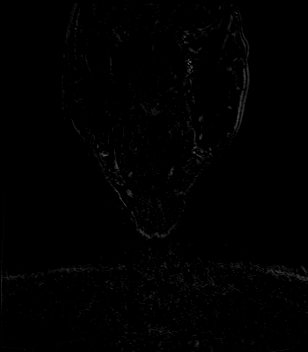
[im 20/78]
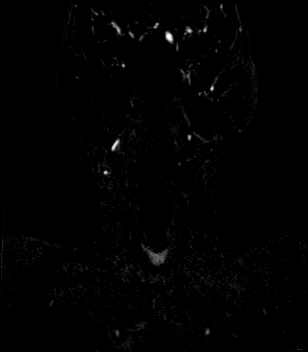
[im 39/78]
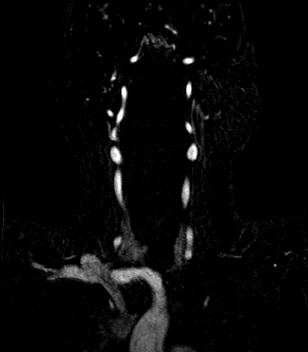
[im 58/78]
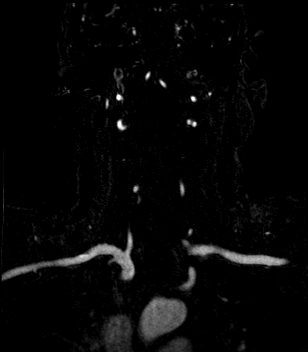
[im 78/78]
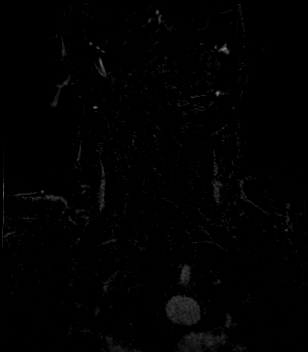

[Series 25: angio_fl3d_cor_post_ttc=3.0s · coronal · 0.9mm · 0.85mm/px · 6 of 80 slices shown (2 of 2)]
[im 1/80]
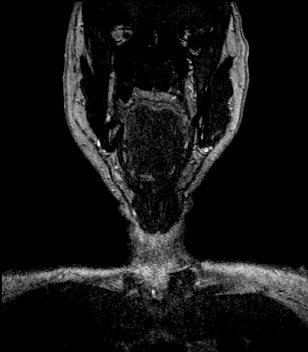
[im 16/80]
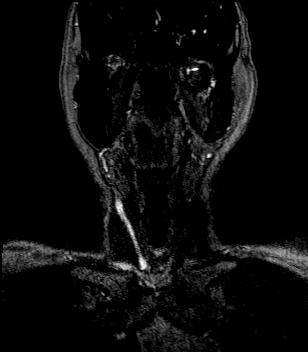
[im 32/80]
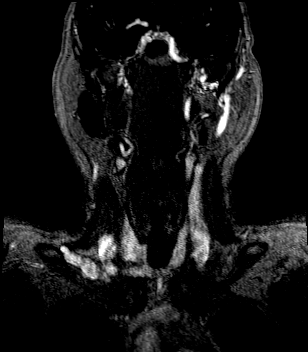
[im 48/80]
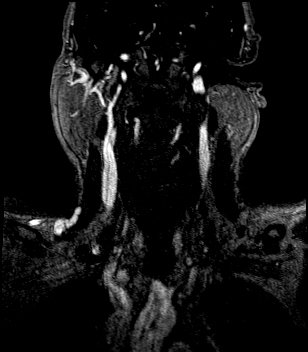
[im 64/80]
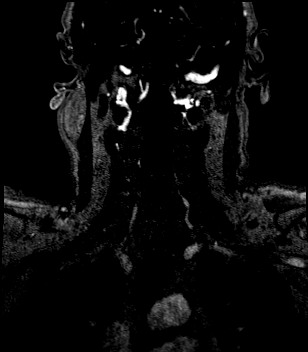
[im 80/80]
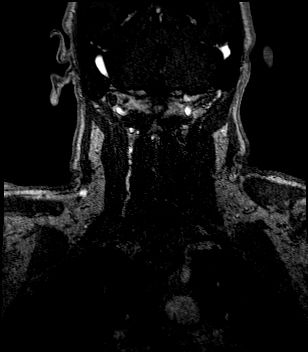

[Series 26: angio_fl3d_cor_post_ttc=3.0s_moco-adv · coronal · 0.9mm · 0.85mm/px · 6 of 80 slices shown (2 of 2)]
[im 1/80]
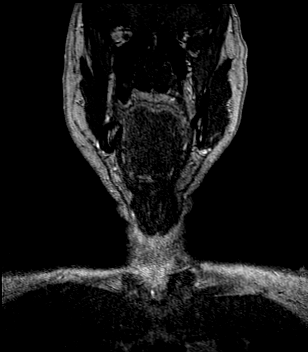
[im 16/80]
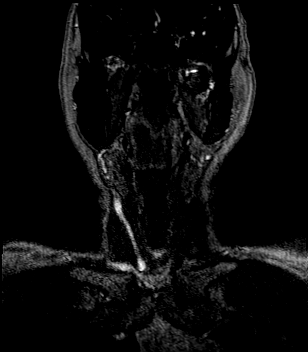
[im 32/80]
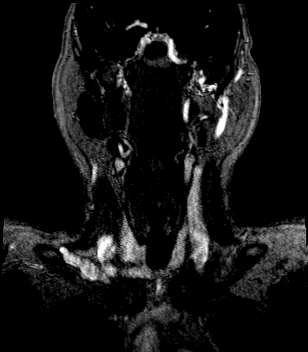
[im 48/80]
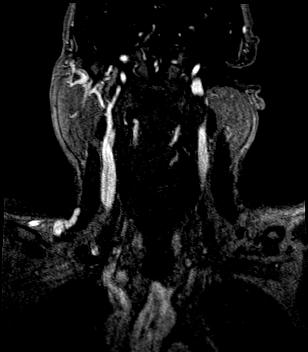
[im 64/80]
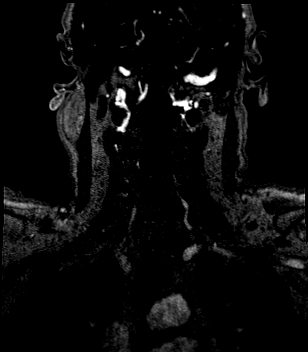
[im 80/80]
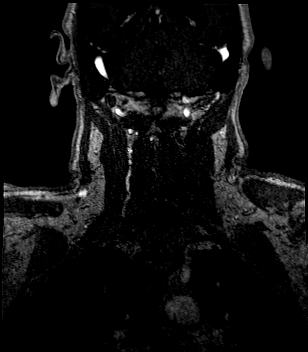

[Series 27: angio_fl3d_cor_post_ttc=3.0s_moco-adv_sub · coronal · 0.9mm · 0.85mm/px · 4 of 80 slices shown (2 of 2)]
[im 1/80]
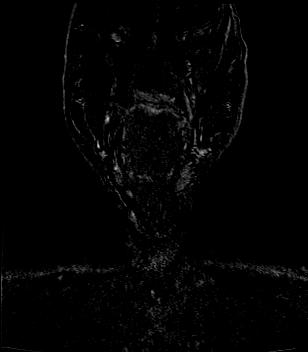
[im 16/80]
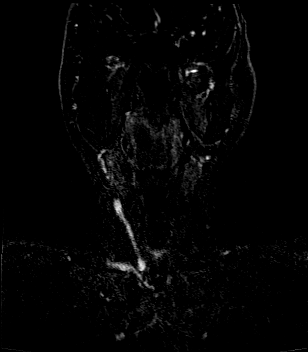
[im 32/80]
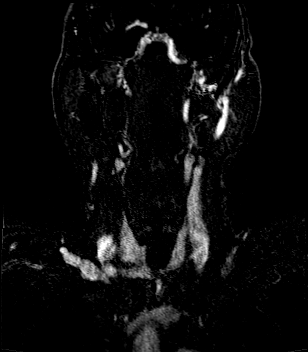
[im 48/80]
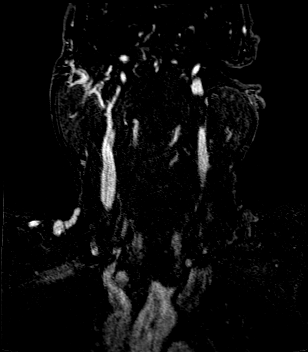

[46 of 48 positions shown; findings below may reference images not displayed]

FINDINGS: Normal codominant vertebral arteries. No stenosis or other
abnormality of the carotid systems. Normal aortic arch branching
pattern.
IMPRESSION: Normal MRA of the neck.

## 2022-11-09 DIAGNOSIS — K921 Melena: Secondary | ICD-10-CM | POA: Diagnosis not present

## 2022-11-09 DIAGNOSIS — R2689 Other abnormalities of gait and mobility: Secondary | ICD-10-CM | POA: Diagnosis not present

## 2022-11-09 DIAGNOSIS — R42 Dizziness and giddiness: Secondary | ICD-10-CM | POA: Diagnosis not present

## 2022-11-09 DIAGNOSIS — W19XXXA Unspecified fall, initial encounter: Secondary | ICD-10-CM | POA: Diagnosis not present

## 2022-11-17 ENCOUNTER — Encounter: Payer: Self-pay | Admitting: Emergency Medicine

## 2022-11-17 ENCOUNTER — Ambulatory Visit
Admission: EM | Admit: 2022-11-17 | Discharge: 2022-11-17 | Disposition: A | Discharge status: Home / Self Care | Payer: Medicare HMO | Attending: Physician Assistant | Admitting: Physician Assistant

## 2022-11-17 DIAGNOSIS — M542 Cervicalgia: Secondary | ICD-10-CM | POA: Diagnosis not present

## 2022-11-17 DIAGNOSIS — I10 Essential (primary) hypertension: Secondary | ICD-10-CM | POA: Diagnosis not present

## 2022-11-17 MED ORDER — METHOCARBAMOL 500 MG PO TABS: 500.0000 mg | ORAL_TABLET | Freq: Four times a day (QID) | ORAL | 0 refills | Status: AC

## 2022-11-17 NOTE — ED Provider Notes (Signed)
[facility]    CSN: [id number] Arrival date & time: 11/17/22  0805      History   Chief Complaint No chief complaint on file.   HPI James Mckinney is a 77 y.o. male.   Patient complains of pain in his neck that began 3 days ago.  Patient reports he thinks it started after sleeping in an unusual position.  Patient reports that pain is relieved by taking Tylenol.  Patient reports he is only taken Tylenol once.  Patient denies any fever or chills he denies any cough or congestion.  Patient has a history of prostate cancer he is currently being treated.  Patient reports that he had a PET scan 3 months ago that did not show any metastatic disease.  Patient's wife reports patient has high blood pressure he states Dr. Wylene Simmer has been trying to get his blood pressure under better control.  The history is provided by the spouse and the patient. No language interpreter was used.    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 ;   10/ 2023  s/p right hemicolectomy ,  negative margins and negative nodes   Claustrophobia    Depression    Family history of breast cancer 02/22/2022   Family history of prostate cancer 02/22/2022   GAD (generalized anxiety disorder)    GERD (gastroesophageal reflux disease)    Glaucoma, both eyes    H/O agent Orange exposure    History of cerebrovascular accident (CVA) with residual deficit 11/27/2020   11-27-2020  ED visit w/ left hemisensory deficits/  follow-up w/ neurologist--- dr Anne Hahn 12-04-2020 , MRI 12-13-2020  subacute infarct right pontine area /  pt relased to pcp/   (05-18-2022  per pt left thum, index, middle fingers numb and part of left arm numb but mild, and left side upper/ lower lips numb   History of hepatitis C    per pt many many yrs ago , thinks was treated   History  of kidney stones    History of transient ischemic attack (TIA) 2002   Hypertension    Left sided numbness 11/2020   CVA residual  left  thumb, index, and middle fingers/  partial left arm mild numbness/  and left side upper/ lower lips   Lower urinary tract symptoms (LUTS)    OA (osteoarthritis)    OSA on CPAP    sleep study in epic 12-21-2013  moderate to severe OSA/  followed by [facility]   (05-18-2022  per pt uses 4-5 nights per week)   PTSD (post-traumatic stress disorder)    Wears glasses     Patient Active Problem List   Diagnosis Date Noted   Cancer of appendix (HCC) 04/02/2022   Primary cancer of appendix (HCC) 04/01/2022   Genetic testing 03/30/2022   Chronic appendicitis 03/16/2022   Family history of prostate cancer 02/22/2022   Family history of breast cancer 02/22/2022   Malignant neoplasm of prostate (HCC) 02/16/2022   Nephrolithiasis 12/03/2015   Hydronephrosis 12/03/2015   Ureteral stone with hydronephrosis 12/03/2015   Essential hypertension 12/03/2015   Glaucoma 12/03/2015   AKI (acute kidney injury) (HCC) 12/03/2015   Leukocytosis 12/03/2015   Constipation 12/03/2015    Past Surgical History:  Procedure Laterality Date   COLONOSCOPY  2016   GOLD SEED IMPLANT  N/A 05/25/2022   Procedure: GOLD SEED IMPLANT;  Surgeon: Noel Christmas, MD;  Location: [facility];  Service: Urology;  Laterality: N/A;   LAPAROSCOPIC APPENDECTOMY N/A 03/16/2022   Procedure: APPENDECTOMY LAPAROSCOPIC;  Surgeon: Luretha Murphy, MD;  Location: [facility];  Service: General;  Laterality: N/A;  90 MIN - ROOM 10   LAPAROSCOPIC PARTIAL RIGHT COLECTOMY Right 04/02/2022   Procedure: LAPAROSCOPIC ASSISTED RIGHT COLECTOMY;  Surgeon: Luretha Murphy, MD;  Location: [facility];  Service: General;  Laterality: Right;   SPACE OAR INSTILLATION N/A 05/25/2022   Procedure: SPACE OAR INSTILLATION;  Surgeon: Noel Christmas, MD;  Location: [facility];  Service: Urology;   Laterality: N/A;   WISDOM TOOTH EXTRACTION         Home Medications    Prior to Admission medications   Medication Sig Start Date End Date Taking? Authorizing Provider  methocarbamol (ROBAXIN) 500 MG tablet Take 1 tablet (500 mg total) by mouth 4 (four) times daily. 11/17/22  Yes Elson Areas, PA-C  acetaminophen (TYLENOL) 500 MG tablet Take 1,000 mg by mouth every 6 (six) hours as needed for mild pain.    [provider]  aspirin 325 MG tablet Take 325 mg by mouth daily.    [provider]  Bismuth Subsalicylate (PEPTO-BISMOL PO) Take by mouth as needed (acid reflux). Patient not taking: Reported on 09/13/2022    [provider]  brimonidine (ALPHAGAN) 0.2 % ophthalmic solution Place 1 drop into the left eye in the morning and at bedtime.    [provider]  Cholecalciferol (VITAMIN D3) 50 MCG (2000 UT) TABS Take 1 tablet by mouth daily.    [provider]  diclofenac Sodium (VOLTAREN) 1 % GEL Apply topically as needed.    [provider]  dorzolamide (TRUSOPT) 2 % ophthalmic solution Place 1 drop into both eyes 2 (two) times daily. 10/15/15   [provider]  hydrOXYzine (ATARAX) 10 MG tablet Take 10 mg by mouth every 8 (eight) hours as needed for itching (rash). Patient not taking: Reported on 09/13/2022    [provider]  latanoprost (XALATAN) 0.005 % ophthalmic solution Place 1 drop into both eyes at bedtime. 10/15/15   [provider]  Multiple Vitamins-Minerals (ONE-A-DAY MENS 50+) TABS Take 1 tablet by mouth daily.    [provider]  olmesartan (BENICAR) 20 MG tablet Take 20 mg by mouth daily. 04/08/22   [provider]  rosuvastatin (CRESTOR) 10 MG tablet Take 10 mg by mouth daily.    [provider]  tamsulosin (FLOMAX) 0.4 MG CAPS capsule Take 1 capsule (0.4 mg total) by mouth daily after supper. Patient not taking: Reported on 09/13/2022 07/02/22   Margaretmary Dys, MD     Family History Family History  Problem Relation Age of Onset   Cancer Mother 28       stomach or other primary?   Diabetes Father    Prostate cancer Father        dx before 26   Lung cancer Father        smoking hx   Multiple myeloma Sister    Breast cancer Sister        dx 87s   Cancer Brother        nasal/sinus?    Social History Social History   Tobacco Use   Smoking status: Never   Smokeless tobacco: Never  Vaping Use   Vaping Use: Never used  Substance Use Topics  Alcohol use: No   Drug use: Never     Allergies   Zestril [lisinopril]   Review of Systems Review of Systems  All other systems reviewed and are negative.    Physical Exam Triage Vital Signs ED Triage Vitals  Enc Vitals Group     BP 11/17/22 0819 (!) 172/95     Pulse Rate 11/17/22 0819 78     Resp 11/17/22 0819 18     Temp 11/17/22 0819 98.6 F (37 C)     Temp Source 11/17/22 0819 Oral     SpO2 11/17/22 0819 94 %     Weight --      Height --      Head Circumference --      Peak Flow --      Pain Score 11/17/22 0821 5     Pain Loc --      Pain Edu? --      Excl. in GC? --    No data found.  Updated Vital Signs BP (!) 172/95 (BP Location: Right Arm)   Pulse 78   Temp 98.6 F (37 C) (Oral)   Resp 18   SpO2 94%   Visual Acuity Right Eye Distance:   Left Eye Distance:   Bilateral Distance:    Right Eye Near:   Left Eye Near:    Bilateral Near:     Physical Exam Vitals and nursing note reviewed.  Constitutional:      Appearance: He is well-developed.  HENT:     Head: Normocephalic.  Cardiovascular:     Rate and Rhythm: Normal rate.  Pulmonary:     Effort: Pulmonary effort is normal.  Abdominal:     General: There is no distension.  Musculoskeletal:        General: No swelling or tenderness. Normal range of motion.     Cervical back: Normal range of motion.     Comments: Pain with range of motion  Neurological:     Mental Status: He is alert and oriented  to person, place, and time.      UC Treatments / Results  Labs (all labs ordered are listed, but only abnormal results are displayed) Labs Reviewed - No data to display  EKG   Radiology No results found.  Procedures Procedures (including critical care time)  Medications Ordered in UC Medications - No data to display  Initial Impression / Assessment and Plan / UC Course  I have reviewed the triage vital signs and the nursing notes.  Pertinent labs & imaging results that were available during my care of the patient were reviewed by me and considered in my medical decision making (see chart for details).     DM: I discussed with patient the limitations of evaluation at urgent care.  I cannot obtain CT imaging.  I have advised the patient that if symptoms worsen or change he should go to the emergency department for evaluation.  Patient is advised he should schedule to follow-up with Dr. Wylene Simmer and with his oncologist.  Patient's wife would like to have patient try a muscle relaxer.  I will give him prescription for Robaxin he is encouraged to continue his Tylenol. Final Clinical Impressions(s) / UC Diagnoses   Final diagnoses:  Neck pain     Discharge Instructions      Go to the Emergency department for evaluation if symptoms worsen or change.     ED Prescriptions     Medication Sig Dispense Auth. Provider  methocarbamol (ROBAXIN) 500 MG tablet Take 1 tablet (500 mg total) by mouth 4 (four) times daily. 20 tablet Elson Areas, [place]      PDMP not reviewed this encounter. An After Visit Summary was printed and given to the patient.        Elson Areas, [place] 11/17/22 [phone]

## 2022-11-17 NOTE — ED Triage Notes (Signed)
Neck pain since Monday.  Woke up this morning and pain was worse.  Has been using a heating pad and ice.  Has been taking tylenol.

## 2022-11-17 NOTE — Discharge Instructions (Addendum)
Go to the Emergency department for evaluation if symptoms worsen or change  

## 2022-11-23 ENCOUNTER — Encounter: Payer: Self-pay | Admitting: Nurse Practitioner

## 2022-11-23 ENCOUNTER — Ambulatory Visit: Payer: Medicare HMO | Admitting: Nurse Practitioner

## 2022-11-23 VITALS — BP 132/80 | HR 85 | Ht 73.0 in | Wt 238.0 lb

## 2022-11-23 DIAGNOSIS — R195 Other fecal abnormalities: Secondary | ICD-10-CM | POA: Diagnosis not present

## 2022-11-23 DIAGNOSIS — C181 Malignant neoplasm of appendix: Secondary | ICD-10-CM

## 2022-11-23 MED ORDER — NA SULFATE-K SULFATE-MG SULF 17.5-3.13-1.6 GM/177ML PO SOLN: 1.0000 | Freq: Once | ORAL | 0 refills | Status: AC

## 2022-11-23 NOTE — Patient Instructions (Signed)
_______________________________________________________  If your blood pressure at your visit was 140/90 or greater, please contact your primary care physician to follow up on this.  _______________________________________________________  If you are age 77 or older, your body mass index should be between 23-30. Your Body mass index is 31.4 kg/m. If this is out of the aforementioned range listed, please consider follow up with your Primary Care Provider.  If you are age 86 or younger, your body mass index should be between 19-25. Your Body mass index is 31.4 kg/m. If this is out of the aformentioned range listed, please consider follow up with your Primary Care Provider.   You have been scheduled for a colonoscopy. Please follow written instructions given to you at your visit today.  Please pick up your prep supplies at the pharmacy within the next 1-3 days. If you use inhalers (even only as needed), please bring them with you on the day of your procedure.   ________________________________________________________  The [facility] providers would like to encourage you to use [facility] to communicate with providers for non-urgent requests or questions.  Due to long hold times on the telephone, sending your provider a message by [facility] may be a faster and more efficient way to get a response.  Please allow 48 business hours for a response.  Please remember that this is for non-urgent requests.   It was a pleasure to see you today!  Thank you for trusting me with your gastrointestinal care!

## 2022-11-23 NOTE — Progress Notes (Signed)
11/23/2022 James Mckinney [id number] 09/06/1945   CHIEF COMPLAINT: Blood in stool  HISTORY OF PRESENT ILLNESS: James Mckinney is a 77 year old male with a past medical history of  anxiety, depression, PTSD, hypertension, CVA 2022, GERD, colon polyps, DVT not on anticoagulation, prostate cancer 12/2021 s/p radiation tx, appendix carcinoma s/p laparoscopic appendectomy 03/16/2022 (path report showed low-grade goblet cell adenocarcinoma tumor invading into the mesoappendix and subserosal) and s/p laparoscopic assisted right hemicolectomy on 04/02/2022. He presents to our office today to schedule a colonoscopy. He recently completed a FOBT test which he reported was positive for blood. He was last seen by his oncologist, Dr. Truett Perna 03/2022 and at that time he recommended for the patient to schedule a colonoscopy within the next year.  He denies having any nausea or vomiting.  No dysphagia or heartburn.  No upper or lower abdominal pain.  He endorsed passing black stools prior to his appendectomy and colon resection without recurrence postoperatively.  He denies passing any bright red blood per the rectum.  He underwent a colonoscopy 5 or 6 years ago by the [place] in [place], [place].  and his wife stated 1 or 2 colon polyps were removed from his colon.  No known family history of colon polyps or colorectal cancer.  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  He takes aspirin 81 mg daily and is not on anticoagulation.  No GERD symptoms.  Denies ever having an EGD.  No history of cardiac or pulmonary disease.     Latest Ref Rng & Units 05/25/2022    7:22 AM 04/05/2022    9:24 AM 04/03/2022    5:07 AM  CBC  WBC 4.0 - 10.5 K/uL  12.9  17.5   Hemoglobin 13.0 - 17.0 g/dL 40.9  81.1  91.4   Hematocrit 39.0 - 52.0 % 51.0  43.0  42.4   Platelets 150 - 400 K/uL  221  187        Latest Ref Rng & Units 05/25/2022    7:22 AM 04/03/2022     5:07 AM 04/02/2022    7:41 AM  CMP  Glucose 70 - 99 mg/dL 95  782  956   BUN 8 - 23 mg/dL 13  12  13    Creatinine 0.61 - 1.24 mg/dL 2.13  0.86  5.78   Sodium 135 - 145 mmol/L 146  139  140   Potassium 3.5 - 5.1 mmol/L 3.9  3.9  MARKED HEMOLYSIS   Chloride 98 - 111 mmol/L 104  107  108   CO2 22 - 32 mmol/L  26  25   Calcium 8.9 - 10.3 mg/dL  8.8  9.0     PET SCAN 02/19/2022: 1. Focal prostate adenocarcinoma in the LEFT peripheral zone. 2. No evidence metastatic adenopathy in the pelvis or periaortic retroperitoneum. 3. No evidence of visceral metastasis or skeletal metastasis.  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 ;   10/ 2023  s/p right hemicolectomy ,  negative margins and negative nodes   Claustrophobia    Depression    Family history of breast cancer 02/22/2022   Family history of prostate cancer 02/22/2022   GAD (generalized anxiety disorder)    GERD (  gastroesophageal reflux disease)    Glaucoma, both eyes    H/O agent Orange exposure    History of cerebrovascular accident (CVA) with residual deficit 11/27/2020   11-27-2020  ED visit w/ left hemisensory deficits/  follow-up w/ neurologist--- dr Anne Hahn 12-04-2020 , MRI 12-13-2020  subacute infarct right pontine area /  pt relased to pcp/   (05-18-2022  per pt left thum, index, middle fingers numb and part of left arm numb but mild, and left side upper/ lower lips numb   History of hepatitis C    per pt many many yrs ago , thinks was treated   History of kidney stones    History of transient ischemic attack (TIA) 2002   Hypertension    Left sided numbness 11/2020   CVA residual  left  thumb, index, and middle fingers/  partial left arm mild numbness/  and left side upper/ lower lips   Lower urinary tract symptoms (LUTS)    OA (osteoarthritis)    OSA on CPAP     sleep study in epic 12-21-2013  moderate to severe OSA/  [facility]   (05-18-2022  per pt uses 4-5 nights per week)   PTSD (post-traumatic stress disorder)    Wears glasses    Past Surgical History:  Procedure Laterality Date   COLONOSCOPY  2016   GOLD SEED IMPLANT N/A 05/25/2022   Procedure: GOLD SEED IMPLANT;  Surgeon: Noel Christmas, MD;  Location: [facility];  Service: Urology;  Laterality: N/A;   LAPAROSCOPIC APPENDECTOMY N/A 03/16/2022   Procedure: APPENDECTOMY LAPAROSCOPIC;  Surgeon: Luretha Murphy, MD;  Location: [facility];  Service: General;  Laterality: N/A;  90 MIN - ROOM 10   LAPAROSCOPIC PARTIAL RIGHT COLECTOMY Right 04/02/2022   Procedure: LAPAROSCOPIC ASSISTED RIGHT COLECTOMY;  Surgeon: Luretha Murphy, MD;  Location: [facility];  Service: General;  Laterality: Right;   SPACE OAR INSTILLATION N/A 05/25/2022   Procedure: SPACE OAR INSTILLATION;  Surgeon: Noel Christmas, MD;  Location: [facility];  Service: Urology;  Laterality: N/A;   WISDOM TOOTH EXTRACTION     Social History: He is married.  Retired Glass blower/designer).  He has 1 son and 1 daughter.  Non-smoker.  No alcohol use.  No drug use.  Family History: Sister with history of breast cancer.  History with multiple myeloma.  Father with history of lung and prostate cancer.  Allergies  Allergen Reactions   Zestril [Lisinopril] Cough      Outpatient Encounter Medications as of 11/23/2022  Medication Sig   acetaminophen (TYLENOL) 500 MG tablet Take 1,000 mg by mouth every 6 (six) hours as needed for mild pain.   aspirin 325 MG tablet Take 325 mg by mouth daily.   Bismuth Subsalicylate (PEPTO-BISMOL PO) Take by mouth as needed (acid reflux). (Patient not taking: Reported on 09/13/2022)   brimonidine (ALPHAGAN) 0.2 % ophthalmic solution Place 1 drop into the left eye in the morning and at bedtime.   Cholecalciferol (VITAMIN D3) 50 MCG (2000 UT) TABS Take 1 tablet by mouth daily.    diclofenac Sodium (VOLTAREN) 1 % GEL Apply topically as needed.   dorzolamide (TRUSOPT) 2 % ophthalmic solution Place 1 drop into both eyes 2 (two) times daily.   hydrOXYzine (ATARAX) 10 MG tablet Take 10 mg by mouth every 8 (eight) hours as needed for itching (rash). (Patient not taking: Reported on 09/13/2022)   latanoprost (XALATAN) 0.005 % ophthalmic solution Place 1 drop into both eyes at  bedtime.   methocarbamol (ROBAXIN) 500 MG tablet Take 1 tablet (500 mg total) by mouth 4 (four) times daily.   Multiple Vitamins-Minerals (ONE-A-DAY MENS 50+) TABS Take 1 tablet by mouth daily.   olmesartan (BENICAR) 20 MG tablet Take 20 mg by mouth daily.   rosuvastatin (CRESTOR) 10 MG tablet Take 10 mg by mouth daily.   tamsulosin (FLOMAX) 0.4 MG CAPS capsule Take 1 capsule (0.4 mg total) by mouth daily after supper. (Patient not taking: Reported on 09/13/2022)   No facility-administered encounter medications on file as of 11/23/2022.    REVIEW OF SYSTEMS:  Gen: Denies fever, sweats or chills. No weight loss.  CV: Denies chest pain, palpitations or edema. Resp: Denies cough, shortness of breath of hemoptysis.  GI: Denies heartburn, dysphagia, stomach or lower abdominal pain. No diarrhea or constipation.  GU : Denies urinary burning, blood in urine, increased urinary frequency or incontinence. MS: + Neck pain. Derm: Denies rash, itchiness, skin lesions or unhealing ulcers. Psych: + Anxiety and depression.  Heme: Denies bruising, easy bleeding. Neuro:  Denies headaches, dizziness or paresthesias. Endo:  Denies any problems with DM, thyroid or adrenal function.  PHYSICAL EXAM: There were no vitals taken for this visit. General: in no acute distress. Head: Normocephalic and atraumatic. Eyes:  Sclerae non-icteric, conjunctive pink. Ears: Normal auditory acuity. Mouth: Dentition intact. No ulcers or lesions.  Neck: Supple, no lymphadenopathy or thyromegaly.  Lungs: Clear bilaterally to  auscultation without wheezes, crackles or rhonchi. Heart: Regular rate and rhythm. No murmur, rub or gallop appreciated.  Abdomen: Soft, nontender, nondistended. No masses. No hepatosplenomegaly. Normoactive bowel sounds x 4 quadrants.  Rectal: Deferred.  Musculoskeletal: Symmetrical with no gross deformities. Skin: Warm and dry. No rash or lesions on visible extremities. Extremities: No edema. Neurological: Alert oriented x 4, no focal deficits.  Psychological:  Alert and cooperative. Normal mood and affect.  ASSESSMENT AND PLAN:  77 year old male with + FOBT. No overt rectal bleeding.  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  -Patient is scheduled for a follow up appointment/labs with Dr. Truett Perna on 12/09/2022  History of prostate cancer T2a adenocarcinoma of the prostate with a Gleason score of 4+4 treated with androgen deprivation therapy and radiation    History of CVA 11/2020 on ASA 325mg  QD  Further recommendations to be determined after colonoscopy completed         CC:  Tisovec, Adelfa Koh, MD

## 2022-12-09 ENCOUNTER — Inpatient Hospital Stay: Payer: Medicare HMO | Attending: Oncology

## 2022-12-09 ENCOUNTER — Inpatient Hospital Stay: Payer: Medicare HMO | Admitting: Oncology

## 2022-12-09 VITALS — BP 164/84 | HR 70 | Temp 98.2°F | Resp 18 | Ht 73.0 in | Wt 239.9 lb

## 2022-12-09 DIAGNOSIS — C181 Malignant neoplasm of appendix: Secondary | ICD-10-CM | POA: Diagnosis not present

## 2022-12-09 DIAGNOSIS — Z86718 Personal history of other venous thrombosis and embolism: Secondary | ICD-10-CM | POA: Insufficient documentation

## 2022-12-09 DIAGNOSIS — Z8546 Personal history of malignant neoplasm of prostate: Secondary | ICD-10-CM | POA: Insufficient documentation

## 2022-12-09 DIAGNOSIS — H409 Unspecified glaucoma: Secondary | ICD-10-CM | POA: Diagnosis not present

## 2022-12-09 DIAGNOSIS — C61 Malignant neoplasm of prostate: Secondary | ICD-10-CM

## 2022-12-09 DIAGNOSIS — Z809 Family history of malignant neoplasm, unspecified: Secondary | ICD-10-CM | POA: Diagnosis not present

## 2022-12-09 LAB — CEA (ACCESS): CEA (CHCC): 1.93 ng/mL (ref 0.00–5.00)

## 2022-12-09 NOTE — Progress Notes (Signed)
[facility] OFFICE PROGRESS NOTE   Diagnosis: Appendix carcinoma  INTERVAL HISTORY:   James James Mckinney returns as scheduled.  He feels well.  No difficulty with bowel function.  He is scheduled for colonoscopy next month.  He completed treatment prostate radiation on 08/05/2022.  He continues follow-up with Dr. Laverle Patter for prostate cancer.  Objective:  Vital signs in last 24 hours:  Blood pressure (!) 164/84, pulse 70, temperature 98.2 F (36.8 C), temperature source Oral, resp. rate 18, height 6\' 1"  (1.854 m), weight 239 lb 14.4 oz (108.8 kg), SpO2 100 %.    Lymphatics: No cervical, supraclavicular, axillary, or inguinal nodes Resp: Distant breath sounds, no respiratory distress Cardio: Regular rate and rhythm GI: Nontender, no mass, no hepatosplenomegaly, no apparent ascites Vascular: Pitting edema at the pretibial area bilaterally   Lab Results:  Lab Results  Component Value Date   WBC 12.9 (H) 04/05/2022   HGB 17.3 (H) 05/25/2022   HCT 51.0 05/25/2022   MCV 85.1 04/05/2022   PLT 221 04/05/2022   NEUTROABS 9.3 (H) 04/05/2022    CMP  Lab Results  Component Value Date   NA 146 (H) 05/25/2022   K 3.9 05/25/2022   CL 104 05/25/2022   CO2 26 04/03/2022   GLUCOSE 95 05/25/2022   BUN 13 05/25/2022   CREATININE 1.00 05/25/2022   CALCIUM 8.8 (L) 04/03/2022   PROT 6.3 (L) 03/17/2022   ALBUMIN 3.2 (L) 03/17/2022   AST 22 03/17/2022   ALT 20 03/17/2022   ALKPHOS 53 03/17/2022   BILITOT 1.0 03/17/2022   GFRNONAA >60 04/03/2022   GFRAA >60 12/05/2015    Lab Results  Component Value Date   CEA1 2.1 03/12/2022   CEA 1.93 12/09/2022   Medications: I have reviewed the patient's current medications.   Assessment/Plan:  Goblet cell adenocarcinoma of the appendix, stage 2A (T3N0) Appendectomy 03/16/2022 low-grade blood cell adenocarcinoma with abundant extracellular mucin diffusely infiltrating the appendiceal wall and focally invading the periappendiceal  adipose, pT3, margins negative, perineural invasion present, no lymphovascular invasion, all margins negative for noninvasive tumor and mucin, all margins negative for invasive carcinoma, MSS, no loss of mismatch repair protein expression Right colon resection 04/02/2022, no residual adenocarcinoma, no metastatic carcinoma in 8 lymph nodes MRI prostate 01/04/2022-venous varix versus dilated and potentially enhancing segment of the appendix CT abdomen/pelvis 01/25/2022-prostamegaly dilated appendix new from 2017 consistent with an appendiceal mucocele  2.   Chronic DVT in the left proximal superficial femoral vein on prostate MRI 01/04/2022 3.   Prostate cancer Gleason 3+3 prostate cancer in 2019, 2% in 1/12 cores on biopsy 04/19/2017 PSA 11 at time of diagnosis PSA 620 2315.8 Prostate MRI 01/05/2019 23-4 peripheral zone lesions, 2 PI-RADS 5 on the left, 1 PI-RADS left, and 1 PI-RADS 3 on the right CT abdomen/pelvis 01/25/2022-severe prostamegaly, no evidence of lymphadenopathy or osseous metastatic disease MRI fusion biopsy of the prostate 02/03/2019 23-8/28 core biopsies positive with maximum Gleason score 4+4 PSMA PET 02/17/2022-focal prostate adenocarcinoma in the left peripheral zone, no evidence of metastatic adenopathy, no evidence of visceral metastases or skeletal metastases He began androgen deprivation therapy and is scheduled to begin external beam radiation 2 months later  4.  Family history of multiple cancers-Ambry genetic panel-MSH3 and MSH6 VUS  5.  Glaucoma   Disposition: James Mckinney is in clinical remission from appendix carcinoma.  He will return for an office visit and CEA in 6 months.  He is scheduled for colonoscopy next month. He continues  follow-up with Dr. Laverle Patter for prostate cancer.  Thornton Papas, MD  12/09/2022  12:07 PM

## 2023-01-14 DIAGNOSIS — I10 Essential (primary) hypertension: Secondary | ICD-10-CM | POA: Diagnosis not present

## 2023-01-14 DIAGNOSIS — C61 Malignant neoplasm of prostate: Secondary | ICD-10-CM | POA: Diagnosis not present

## 2023-01-14 DIAGNOSIS — E785 Hyperlipidemia, unspecified: Secondary | ICD-10-CM | POA: Diagnosis not present

## 2023-01-14 DIAGNOSIS — Z1212 Encounter for screening for malignant neoplasm of rectum: Secondary | ICD-10-CM | POA: Diagnosis not present

## 2023-01-14 DIAGNOSIS — E7800 Pure hypercholesterolemia, unspecified: Secondary | ICD-10-CM | POA: Diagnosis not present

## 2023-01-21 DIAGNOSIS — F4310 Post-traumatic stress disorder, unspecified: Secondary | ICD-10-CM | POA: Diagnosis not present

## 2023-01-21 DIAGNOSIS — E7800 Pure hypercholesterolemia, unspecified: Secondary | ICD-10-CM | POA: Diagnosis not present

## 2023-01-21 DIAGNOSIS — Z1339 Encounter for screening examination for other mental health and behavioral disorders: Secondary | ICD-10-CM | POA: Diagnosis not present

## 2023-01-21 DIAGNOSIS — Z6831 Body mass index (BMI) 31.0-31.9, adult: Secondary | ICD-10-CM | POA: Diagnosis not present

## 2023-01-21 DIAGNOSIS — I10 Essential (primary) hypertension: Secondary | ICD-10-CM | POA: Diagnosis not present

## 2023-01-21 DIAGNOSIS — C61 Malignant neoplasm of prostate: Secondary | ICD-10-CM | POA: Diagnosis not present

## 2023-01-21 DIAGNOSIS — E669 Obesity, unspecified: Secondary | ICD-10-CM | POA: Diagnosis not present

## 2023-01-21 DIAGNOSIS — C181 Malignant neoplasm of appendix: Secondary | ICD-10-CM | POA: Diagnosis not present

## 2023-01-21 DIAGNOSIS — Z0000 Encounter for general adult medical examination without abnormal findings: Secondary | ICD-10-CM | POA: Diagnosis not present

## 2023-01-21 DIAGNOSIS — R82998 Other abnormal findings in urine: Secondary | ICD-10-CM | POA: Diagnosis not present

## 2023-01-21 DIAGNOSIS — Z1331 Encounter for screening for depression: Secondary | ICD-10-CM | POA: Diagnosis not present

## 2023-01-21 DIAGNOSIS — D692 Other nonthrombocytopenic purpura: Secondary | ICD-10-CM | POA: Diagnosis not present

## 2023-01-25 ENCOUNTER — Encounter: Payer: Self-pay | Admitting: Gastroenterology

## 2023-01-25 ENCOUNTER — Ambulatory Visit (AMBULATORY_SURGERY_CENTER): Payer: Medicare HMO | Admitting: Gastroenterology

## 2023-01-25 VITALS — BP 124/80 | HR 75 | Temp 98.0°F | Resp 16 | Ht 73.0 in | Wt 238.0 lb

## 2023-01-25 DIAGNOSIS — G4733 Obstructive sleep apnea (adult) (pediatric): Secondary | ICD-10-CM | POA: Diagnosis not present

## 2023-01-25 DIAGNOSIS — C181 Malignant neoplasm of appendix: Secondary | ICD-10-CM | POA: Diagnosis not present

## 2023-01-25 DIAGNOSIS — Z1211 Encounter for screening for malignant neoplasm of colon: Secondary | ICD-10-CM | POA: Diagnosis not present

## 2023-01-25 DIAGNOSIS — Z85038 Personal history of other malignant neoplasm of large intestine: Secondary | ICD-10-CM

## 2023-01-25 DIAGNOSIS — Z08 Encounter for follow-up examination after completed treatment for malignant neoplasm: Secondary | ICD-10-CM

## 2023-01-25 DIAGNOSIS — F411 Generalized anxiety disorder: Secondary | ICD-10-CM | POA: Diagnosis not present

## 2023-01-25 DIAGNOSIS — R195 Other fecal abnormalities: Secondary | ICD-10-CM

## 2023-01-25 DIAGNOSIS — I10 Essential (primary) hypertension: Secondary | ICD-10-CM | POA: Diagnosis not present

## 2023-01-25 DIAGNOSIS — F32A Depression, unspecified: Secondary | ICD-10-CM | POA: Diagnosis not present

## 2023-01-25 MED ORDER — SODIUM CHLORIDE 0.9 % IV SOLN
500.0000 mL | INTRAVENOUS | Status: DC
Start: 2023-01-25 — End: 2023-01-25

## 2023-01-25 NOTE — Patient Instructions (Addendum)
Handout on diverticulosis given.                               Resume previous diet.                           - Continue present medications.                           - Repeat colonoscopy in 3 years for surveillance for appendiceal adenoca s/p resection. Napoleon Form, MD    YOU HAD AN ENDOSCOPIC PROCEDURE TODAY AT THE [facility]:   Refer to the procedure report that was given to you for any specific questions about what was found during the examination.  If the procedure report does not answer your questions, please call your gastroenterologist to clarify.  If you requested that your care partner not be given the details of your procedure findings, then the procedure report has been included in a sealed envelope for you to review at your convenience later.  YOU SHOULD EXPECT: Some feelings of bloating in the abdomen. Passage of more gas than usual.  Walking can help get rid of the air that was put into your GI tract during the procedure and reduce the bloating. If you had a lower endoscopy (such as a colonoscopy or flexible sigmoidoscopy) you may notice spotting of blood in your stool or on the toilet paper. If you underwent a bowel prep for your procedure, you may not have a normal bowel movement for a few days.  Please Note:  You might notice some irritation and congestion in your nose or some drainage.  This is from the oxygen used during your procedure.  There is no need for concern and it should clear up in a day or so.  SYMPTOMS TO REPORT IMMEDIATELY:  Following lower endoscopy (colonoscopy or flexible sigmoidoscopy):  Excessive amounts of blood in the stool  Significant tenderness or worsening of abdominal pains  Swelling of the abdomen that is new, acute  Fever of 100F or higher   For urgent or emergent issues, a gastroenterologist can be reached at any hour by calling [phone]. Do not use MyChart messaging for urgent concerns.    DIET:  We do  recommend a small meal at first, but then you may proceed to your regular diet.  Drink plenty of fluids but you should avoid alcoholic beverages for 24 hours.  ACTIVITY:  You should plan to take it easy for the rest of today and you should NOT DRIVE or use heavy machinery until tomorrow (because of the sedation medicines used during the test).    FOLLOW UP: Our staff will call the number listed on your records the next business day following your procedure.  We will call around 7:15- 8:00 am to check on you and address any questions or concerns that you may have regarding the information given to you following your procedure. If we do not reach you, we will leave a message.     If any biopsies were taken you will be contacted by phone or by letter within the next 1-3 weeks.  Please call us at [phone] if you have not heard about the biopsies in 3 weeks.    SIGNATURES/CONFIDENTIALITY: You and/or your care partner have signed paperwork which will be entered into your electronic medical record.  These signatures attest to the fact that that the information above on your After Visit Summary has been reviewed and is understood.  Full responsibility of the confidentiality of this discharge information lies with you and/or your care-partner.

## 2023-01-25 NOTE — Progress Notes (Unsigned)
[facility] History and Physical   Primary Care Physician:  Tisovec, Adelfa Koh, MD   Reason for Procedure:  Positive FIT  Plan:    colonoscopy with possible interventions as needed     HPI: James Mckinney is a very pleasant 77 y.o. male here for colonoscopy for positive FIT s/p surgery for appendiceal cancer.   The risks and benefits as well as alternatives of endoscopic procedure(s) have been discussed and reviewed. All questions answered. The patient agrees to proceed.    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 ;   10/ 2023  s/p right hemicolectomy ,  negative margins and negative nodes   Claustrophobia    Depression    Family history of breast cancer 02/22/2022   Family history of prostate cancer 02/22/2022   GAD (generalized anxiety disorder)    GERD (gastroesophageal reflux disease)    Glaucoma, both eyes    H/O agent Orange exposure    History of cerebrovascular accident (CVA) with residual deficit 11/27/2020   11-27-2020  ED visit w/ left hemisensory deficits/  follow-up w/ neurologist--- dr Anne Hahn 12-04-2020 , MRI 12-13-2020  subacute infarct right pontine area /  pt relased to pcp/   (05-18-2022  per pt left thum, index, middle fingers numb and part of left arm numb but mild, and left side upper/ lower lips numb   History of hepatitis C    per pt many many yrs ago , thinks was treated   History of kidney stones    History of transient ischemic attack (TIA) 2002   Hypertension    Left sided numbness 11/2020   CVA residual  left  thumb, index, and middle fingers/  partial left arm mild numbness/  and left side upper/ lower lips   Lower urinary tract symptoms (LUTS)    OA (osteoarthritis)    OSA on CPAP    sleep study in epic 12-21-2013  moderate to severe OSA/  [facility]   (05-18-2022   per pt uses 4-5 nights per week)   PTSD (post-traumatic stress disorder)    Wears glasses     Past Surgical History:  Procedure Laterality Date   COLONOSCOPY  2016   GOLD SEED IMPLANT N/A 05/25/2022   Procedure: GOLD SEED IMPLANT;  Surgeon: Noel Christmas, MD;  [facility];  Service: Urology;  Laterality: N/A;   LAPAROSCOPIC APPENDECTOMY N/A 03/16/2022   Procedure: APPENDECTOMY LAPAROSCOPIC;  Surgeon: Luretha Murphy, MD;  Location: [facility];  Service: General;  Laterality: N/A;  90 MIN - ROOM 10   LAPAROSCOPIC PARTIAL RIGHT COLECTOMY Right 04/02/2022   Procedure: LAPAROSCOPIC ASSISTED RIGHT COLECTOMY;  Surgeon: Luretha Murphy, MD;  Location: [facility];  Service: General;  Laterality: Right;   SPACE OAR INSTILLATION N/A 05/25/2022   Procedure: SPACE OAR INSTILLATION;  Surgeon: Noel Christmas, MD;  Location: [facility];  Service: Urology;  Laterality: N/A;   WISDOM TOOTH EXTRACTION      Prior to Admission medications   Medication Sig Start Date End Date Taking? Authorizing Provider  acetaminophen (TYLENOL) 500 MG tablet Take 1,000 mg by mouth every 6 (six) hours as needed for mild pain.   Yes [provider]  brimonidine (ALPHAGAN) 0.2 % ophthalmic solution Place 1 drop into the left eye in the morning  and at bedtime.   Yes [provider]  Cholecalciferol (VITAMIN D3) 50 MCG (2000 UT) TABS Take 1 tablet by mouth daily.   Yes [provider]  dorzolamide (TRUSOPT) 2 % ophthalmic solution Place 1 drop into both eyes 2 (two) times daily. 10/15/15  Yes [provider]  latanoprost (XALATAN) 0.005 % ophthalmic solution Place 1 drop into both eyes at bedtime. 10/15/15  Yes [provider]  Multiple Vitamins-Minerals (ONE-A-DAY MENS 50+) TABS Take 1 tablet by mouth daily.   Yes [provider]  olmesartan (BENICAR) 40 MG tablet Take 40 mg by mouth at bedtime. 11/16/22  Yes [provider]   aspirin 325 MG tablet Take 325 mg by mouth daily.    [provider]  methocarbamol (ROBAXIN) 500 MG tablet Take 1 tablet (500 mg total) by mouth 4 (four) times daily. Patient not taking: Reported on 12/09/2022 11/17/22   Elson Areas, PA-C  rosuvastatin (CRESTOR) 10 MG tablet Take 10 mg by mouth daily.    [provider]  traZODone (DESYREL) 50 MG tablet Take 50 mg by mouth at bedtime as needed. 11/10/22   [provider]    Current Outpatient Medications  Medication Sig Dispense Refill   acetaminophen (TYLENOL) 500 MG tablet Take 1,000 mg by mouth every 6 (six) hours as needed for mild pain.     brimonidine (ALPHAGAN) 0.2 % ophthalmic solution Place 1 drop into the left eye in the morning and at bedtime.     Cholecalciferol (VITAMIN D3) 50 MCG (2000 UT) TABS Take 1 tablet by mouth daily.     dorzolamide (TRUSOPT) 2 % ophthalmic solution Place 1 drop into both eyes 2 (two) times daily.  0   latanoprost (XALATAN) 0.005 % ophthalmic solution Place 1 drop into both eyes at bedtime.  0   Multiple Vitamins-Minerals (ONE-A-DAY MENS 50+) TABS Take 1 tablet by mouth daily.     olmesartan (BENICAR) 40 MG tablet Take 40 mg by mouth at bedtime.     aspirin 325 MG tablet Take 325 mg by mouth daily.     methocarbamol (ROBAXIN) 500 MG tablet Take 1 tablet (500 mg total) by mouth 4 (four) times daily. (Patient not taking: Reported on 12/09/2022) 20 tablet 0   rosuvastatin (CRESTOR) 10 MG tablet Take 10 mg by mouth daily.     traZODone (DESYREL) 50 MG tablet Take 50 mg by mouth at bedtime as needed.     Current Facility-Administered Medications  Medication Dose Route Frequency Provider Last Rate Last Admin   0.9 %  sodium chloride infusion  500 mL Intravenous Continuous Soul Hackman, Eleonore Chiquito, MD        Allergies as of 01/25/2023 - Review Complete 01/25/2023  Allergen Reaction Noted   Zestril [lisinopril] Cough 03/12/2022    Family History  Problem Relation Age of Onset    Cancer Mother 52       stomach or other primary?   Diabetes Father    Prostate cancer Father        dx before 76   Lung cancer Father        smoking hx   Multiple myeloma Sister    Breast cancer Sister        dx 43s   Cancer Brother        nasal/sinus?    Social History   Socioeconomic History   Marital status: Married    Spouse name: Rosette   Number of children: Not on file  Years of education: Not on file   Highest education level: Not on file  Occupational History   Not on file  Tobacco Use   Smoking status: Never   Smokeless tobacco: Never  Vaping Use   Vaping status: Never Used  Substance and Sexual Activity   Alcohol use: No   Drug use: Never   Sexual activity: Not on file  Other Topics Concern   Not on file  Social History Narrative   Lives with wife   Right Handed   Drinks 2-3 cups caffeine   Social Determinants of Health   Financial Resource Strain: Not on file  Food Insecurity: Not on file  Transportation Needs: Not on file  Physical Activity: Not on file  Stress: Not on file  Social Connections: Not on file  Intimate Partner Violence: Not on file    Review of Systems:  All other review of systems negative except as mentioned in the HPI.  Physical Exam: Vital signs in last 24 hours: BP 129/72   Pulse 91   Temp 98 F (36.7 C)   Ht 6\' 1"  (1.854 m)   Wt 238 lb (108 kg)   SpO2 97%   BMI 31.40 kg/m  General:   Alert, NAD Lungs:  Clear .   Heart:  Regular rate and rhythm Abdomen:  Soft, nontender and nondistended. Neuro/Psych:  Alert and cooperative. Normal mood and affect. A and O x 3  Reviewed labs, radiology imaging, old records and pertinent past GI work up  Patient is appropriate for planned procedure(s) and anesthesia in an ambulatory setting   K. Scherry Ran , MD [phone]

## 2023-01-25 NOTE — Op Note (Signed)
[facility] Patient Name: James Mckinney Procedure Date: 01/25/2023 3:17 PM MRN: [id number] Endoscopist: Napoleon Form , MD, [id number] Age: 77 Referring MD:  Date of Birth: November 30, 1945 Gender: Male Account #: [id number] Procedure:                Colonoscopy Indications:              Cancer of the appendix s/p resection, Positive                            fecal immunochemical test Medicines:                Monitored Anesthesia Care Procedure:                Pre-Anesthesia Assessment:                           - Prior to the procedure, a History and Physical                            was performed, and patient medications and                            allergies were reviewed. The patient's tolerance of                            previous anesthesia was also reviewed. The risks                            and benefits of the procedure and the sedation                            options and risks were discussed with the patient.                            All questions were answered, and informed consent                            was obtained. Prior Anticoagulants: The patient has                            taken no anticoagulant or antiplatelet agents. ASA                            Grade Assessment: III - A patient with severe                            systemic disease. After reviewing the risks and                            benefits, the patient was deemed in satisfactory                            condition to undergo the procedure.  After obtaining informed consent, the colonoscope                            was passed under direct vision. Throughout the                            procedure, the patient's blood pressure, pulse, and                            oxygen saturations were monitored continuously. The                            Olympus Scope [id number] was introduced through the                            anus and advanced to the  the cecum, identified by                            appendiceal orifice and ileocecal valve. The                            colonoscopy was performed without difficulty. The                            patient tolerated the procedure well. The quality                            of the bowel preparation was adequate. The                            ileocecal valve, appendiceal orifice, and rectum                            were photographed. Scope In: 3:21:43 PM Scope Out: 3:33:12 PM Scope Withdrawal Time: 0 hours 6 minutes 1 second  Total Procedure Duration: 0 hours 11 minutes 29 seconds  Findings:                 The perianal and digital rectal examinations were                            normal.                           There was evidence of a prior end-to-side                            ileo-colonic anastomosis in the ascending colon.                            This was patent and was characterized by healthy                            appearing mucosa. The anastomosis was traversed.  A few small-mouthed diverticula were found in the                            sigmoid colon and descending colon.                           Non-bleeding external and internal hemorrhoids were                            found during retroflexion. The hemorrhoids were                            medium-sized. Complications:            No immediate complications. Estimated Blood Loss:     Estimated blood loss was minimal. Impression:               - Patent end-to-side ileo-colonic anastomosis,                            characterized by healthy appearing mucosa.                           - Diverticulosis in the sigmoid colon and in the                            descending colon.                           - Non-bleeding external and internal hemorrhoids.                           - No specimens collected. Recommendation:           - Patient has a contact number available for                             emergencies. The signs and symptoms of potential                            delayed complications were discussed with the                            patient. Return to normal activities tomorrow.                            Written discharge instructions were provided to the                            patient.                           - Resume previous diet.                           - Continue present medications.                           -  Repeat colonoscopy in 3 years for surveillance                            for appendiceal adenoca s/p resection. Napoleon Form, MD 01/25/2023 3:43:51 PM This report has been signed electronically.

## 2023-01-25 NOTE — Progress Notes (Signed)
Pt's states no medical or surgical changes since previsit or office visit. 

## 2023-01-25 NOTE — Progress Notes (Unsigned)
Uneventful anesthetic. Report to pacu rn. Vss. Care resumed by rn. 

## 2023-01-26 ENCOUNTER — Telehealth: Payer: Self-pay

## 2023-01-26 NOTE — Telephone Encounter (Signed)
Follow up call placed, VM obtained and message left. 

## 2023-03-02 DIAGNOSIS — C61 Malignant neoplasm of prostate: Secondary | ICD-10-CM | POA: Diagnosis not present

## 2023-03-07 DIAGNOSIS — H401132 Primary open-angle glaucoma, bilateral, moderate stage: Secondary | ICD-10-CM | POA: Diagnosis not present

## 2023-03-07 DIAGNOSIS — H35373 Puckering of macula, bilateral: Secondary | ICD-10-CM | POA: Diagnosis not present

## 2023-03-07 DIAGNOSIS — H34812 Central retinal vein occlusion, left eye, with macular edema: Secondary | ICD-10-CM | POA: Diagnosis not present

## 2023-03-07 DIAGNOSIS — H31093 Other chorioretinal scars, bilateral: Secondary | ICD-10-CM | POA: Diagnosis not present

## 2023-03-09 DIAGNOSIS — C61 Malignant neoplasm of prostate: Secondary | ICD-10-CM | POA: Diagnosis not present

## 2023-06-09 DIAGNOSIS — N401 Enlarged prostate with lower urinary tract symptoms: Secondary | ICD-10-CM | POA: Diagnosis not present

## 2023-06-09 DIAGNOSIS — C61 Malignant neoplasm of prostate: Secondary | ICD-10-CM | POA: Diagnosis not present

## 2023-06-09 DIAGNOSIS — N390 Urinary tract infection, site not specified: Secondary | ICD-10-CM | POA: Diagnosis not present

## 2023-06-09 DIAGNOSIS — Z87442 Personal history of urinary calculi: Secondary | ICD-10-CM | POA: Diagnosis not present

## 2023-06-09 DIAGNOSIS — R310 Gross hematuria: Secondary | ICD-10-CM | POA: Diagnosis not present

## 2023-06-10 ENCOUNTER — Encounter: Payer: Self-pay | Admitting: Oncology

## 2023-06-10 ENCOUNTER — Telehealth: Payer: Self-pay

## 2023-06-10 ENCOUNTER — Inpatient Hospital Stay: Payer: Medicare HMO

## 2023-06-10 ENCOUNTER — Inpatient Hospital Stay: Payer: Medicare HMO | Attending: Oncology | Admitting: Oncology

## 2023-06-10 VITALS — BP 133/75 | HR 81 | Temp 98.7°F | Resp 18 | Ht 73.0 in | Wt 243.7 lb

## 2023-06-10 DIAGNOSIS — H409 Unspecified glaucoma: Secondary | ICD-10-CM | POA: Diagnosis not present

## 2023-06-10 DIAGNOSIS — Z809 Family history of malignant neoplasm, unspecified: Secondary | ICD-10-CM | POA: Insufficient documentation

## 2023-06-10 DIAGNOSIS — C181 Malignant neoplasm of appendix: Secondary | ICD-10-CM | POA: Insufficient documentation

## 2023-06-10 DIAGNOSIS — Z86718 Personal history of other venous thrombosis and embolism: Secondary | ICD-10-CM | POA: Diagnosis not present

## 2023-06-10 DIAGNOSIS — C61 Malignant neoplasm of prostate: Secondary | ICD-10-CM | POA: Insufficient documentation

## 2023-06-10 DIAGNOSIS — Z923 Personal history of irradiation: Secondary | ICD-10-CM | POA: Insufficient documentation

## 2023-06-10 LAB — CEA (ACCESS): CEA (CHCC): 1.79 ng/mL (ref 0.00–5.00)

## 2023-06-10 NOTE — Telephone Encounter (Signed)
Patient have verbal understanding and had no further questions or concerns 

## 2023-06-10 NOTE — Progress Notes (Signed)
[facility] OFFICE PROGRESS NOTE   Diagnosis: Appendix carcinoma  INTERVAL HISTORY:   James Mckinney returns as scheduled.  He feels well.  Good appetite.  No difficulty with bowel or bladder function.  He reports 1 episode of blood in his underwear.  He thinks this was from his urine.  He wonders whether he injured his penis on his zipper.  He saw Dr. Wylene Simmer.  No other bleeding. He had a negative colonoscopy 01/25/2023   Objective:  Vital signs in last 24 hours:  Blood pressure 133/75, pulse 81, temperature 98.7 F (37.1 C), temperature source Temporal, resp. rate 18, height 6\' 1"  (1.854 m), weight 243 lb 11.2 oz (110.5 kg), SpO2 99%.    Lymphatics: No cervical, supraclavicular, axillary, or inguinal nodes Resp: Distant breath sounds, no respiratory distress Cardio: Regular rate and rhythm GI: No hepatosplenomegaly, nontender, no mass Vascular: No leg edema   Lab Results:  Lab Results  Component Value Date   WBC 12.9 (H) 04/05/2022   HGB 17.3 (H) 05/25/2022   HCT 51.0 05/25/2022   MCV 85.1 04/05/2022   PLT 221 04/05/2022   NEUTROABS 9.3 (H) 04/05/2022    CMP  Lab Results  Component Value Date   NA 146 (H) 05/25/2022   K 3.9 05/25/2022   CL 104 05/25/2022   CO2 26 04/03/2022   GLUCOSE 95 05/25/2022   BUN 13 05/25/2022   CREATININE 1.00 05/25/2022   CALCIUM 8.8 (L) 04/03/2022   PROT 6.3 (L) 03/17/2022   ALBUMIN 3.2 (L) 03/17/2022   AST 22 03/17/2022   ALT 20 03/17/2022   ALKPHOS 53 03/17/2022   BILITOT 1.0 03/17/2022   GFRNONAA >60 04/03/2022   GFRAA >60 12/05/2015    Lab Results  Component Value Date   CEA1 2.1 03/12/2022   CEA 1.93 12/09/2022     Medications: I have reviewed the patient's current medications.   Assessment/Plan: Goblet cell adenocarcinoma of the appendix, stage 2A (T3N0) Appendectomy 03/16/2022 low-grade blood cell adenocarcinoma with abundant extracellular mucin diffusely infiltrating the appendiceal wall and focally  invading the periappendiceal adipose, pT3, margins negative, perineural invasion present, no lymphovascular invasion, all margins negative for noninvasive tumor and mucin, all margins negative for invasive carcinoma, MSS, no loss of mismatch repair protein expression Right colon resection 04/02/2022, no residual adenocarcinoma, no metastatic carcinoma in 8 lymph nodes MRI prostate 01/04/2022-venous varix versus dilated and potentially enhancing segment of the appendix CT abdomen/pelvis 01/25/2022-prostamegaly dilated appendix new from 2017 consistent with an appendiceal mucocele Colonoscopy 01/25/2023: No polyps  2.   Chronic DVT in the left proximal superficial femoral vein on prostate MRI 01/04/2022 3.   Prostate cancer Gleason 3+3 prostate cancer in 2019, 2% in 1/12 cores on biopsy 04/19/2017 PSA 11 at time of diagnosis PSA 15.8 on 12/01/2021 Prostate MRI 01/04/2022-4 peripheral zone lesions, 2 PI-RADS 5 on the left, 1 PI-RADS left, and 1 PI-RADS 3 on the right CT abdomen/pelvis 01/25/2022-severe prostamegaly, no evidence of lymphadenopathy or osseous metastatic disease MRI fusion biopsy of the prostate 02/03/2019 23-8/28 core biopsies positive with maximum Gleason score 4+4 PSMA PET 02/17/2022-focal prostate adenocarcinoma in the left peripheral zone, no evidence of metastatic adenopathy, no evidence of visceral metastases or skeletal metastases He completed prostate radiation 06/07/2022 - 08/05/2022 4.  Family history of multiple cancers-Ambry genetic panel-MSH3 and MSH6 VUS  5.  Glaucoma    Disposition: James Breyer is in clinical remission from appendix carcinoma.  We will follow-up on the CEA from today.  He will  return for an office visit and CEA in 6 months.  He continues follow-up with Dr. Laverle Patter for prostate cancer.  Thornton Papas, MD  06/10/2023  8:26 AM

## 2023-06-10 NOTE — Telephone Encounter (Signed)
-----   Message from Thornton Papas sent at 06/10/2023  3:51 PM EST ----- Please call patient, the CEA is normal, follow-up as scheduled

## 2023-08-03 DIAGNOSIS — C61 Malignant neoplasm of prostate: Secondary | ICD-10-CM | POA: Diagnosis not present

## 2023-08-03 DIAGNOSIS — E669 Obesity, unspecified: Secondary | ICD-10-CM | POA: Diagnosis not present

## 2023-08-03 DIAGNOSIS — E7800 Pure hypercholesterolemia, unspecified: Secondary | ICD-10-CM | POA: Diagnosis not present

## 2023-08-03 DIAGNOSIS — N401 Enlarged prostate with lower urinary tract symptoms: Secondary | ICD-10-CM | POA: Diagnosis not present

## 2023-08-03 DIAGNOSIS — F4310 Post-traumatic stress disorder, unspecified: Secondary | ICD-10-CM | POA: Diagnosis not present

## 2023-08-03 DIAGNOSIS — C181 Malignant neoplasm of appendix: Secondary | ICD-10-CM | POA: Diagnosis not present

## 2023-08-03 DIAGNOSIS — I10 Essential (primary) hypertension: Secondary | ICD-10-CM | POA: Diagnosis not present

## 2023-08-03 DIAGNOSIS — H409 Unspecified glaucoma: Secondary | ICD-10-CM | POA: Diagnosis not present

## 2023-10-24 DIAGNOSIS — M25422 Effusion, left elbow: Secondary | ICD-10-CM | POA: Diagnosis not present

## 2023-10-24 DIAGNOSIS — I10 Essential (primary) hypertension: Secondary | ICD-10-CM | POA: Diagnosis not present

## 2023-10-24 DIAGNOSIS — M7022 Olecranon bursitis, left elbow: Secondary | ICD-10-CM | POA: Diagnosis not present

## 2023-10-28 DIAGNOSIS — M7022 Olecranon bursitis, left elbow: Secondary | ICD-10-CM | POA: Diagnosis not present

## 2023-10-28 DIAGNOSIS — M25522 Pain in left elbow: Secondary | ICD-10-CM | POA: Diagnosis not present

## 2023-11-02 DIAGNOSIS — M7022 Olecranon bursitis, left elbow: Secondary | ICD-10-CM | POA: Diagnosis not present

## 2023-11-18 DIAGNOSIS — C61 Malignant neoplasm of prostate: Secondary | ICD-10-CM | POA: Diagnosis not present

## 2023-11-25 DIAGNOSIS — C61 Malignant neoplasm of prostate: Secondary | ICD-10-CM | POA: Diagnosis not present

## 2023-12-09 ENCOUNTER — Ambulatory Visit: Payer: Self-pay | Admitting: Oncology

## 2023-12-09 ENCOUNTER — Encounter: Payer: Self-pay | Admitting: Oncology

## 2023-12-09 ENCOUNTER — Inpatient Hospital Stay: Payer: Medicare HMO | Attending: Oncology

## 2023-12-09 ENCOUNTER — Inpatient Hospital Stay (HOSPITAL_BASED_OUTPATIENT_CLINIC_OR_DEPARTMENT_OTHER): Payer: Medicare HMO | Admitting: Oncology

## 2023-12-09 VITALS — BP 130/74 | HR 73 | Temp 98.1°F | Resp 18 | Ht 73.0 in | Wt 250.0 lb

## 2023-12-09 DIAGNOSIS — C61 Malignant neoplasm of prostate: Secondary | ICD-10-CM | POA: Diagnosis not present

## 2023-12-09 DIAGNOSIS — Z809 Family history of malignant neoplasm, unspecified: Secondary | ICD-10-CM | POA: Diagnosis not present

## 2023-12-09 DIAGNOSIS — C181 Malignant neoplasm of appendix: Secondary | ICD-10-CM | POA: Diagnosis not present

## 2023-12-09 DIAGNOSIS — Z923 Personal history of irradiation: Secondary | ICD-10-CM | POA: Diagnosis not present

## 2023-12-09 DIAGNOSIS — Z86718 Personal history of other venous thrombosis and embolism: Secondary | ICD-10-CM | POA: Diagnosis not present

## 2023-12-09 DIAGNOSIS — H409 Unspecified glaucoma: Secondary | ICD-10-CM | POA: Diagnosis not present

## 2023-12-09 LAB — CEA (ACCESS): CEA (CHCC): 1.57 ng/mL (ref 0.00–5.00)

## 2023-12-09 NOTE — Progress Notes (Signed)
  [facility] OFFICE PROGRESS NOTE   Diagnosis: Appendix carcinoma, prostate cancer  INTERVAL HISTORY:   Mr.Swindler returns as scheduled.  No difficulty with bowel function.  No bleeding.  Good appetite.  He reports malaise and hot flashes while on androgen deprivation therapy.  He reports hormonal therapy was discontinued a few weeks ago.  Objective:  Vital signs in last 24 hours:  Blood pressure 130/74, pulse 73, temperature 98.1 F (36.7 C), temperature source Temporal, resp. rate 18, height 6' 1 (1.854 m), weight 250 lb (113.4 kg), SpO2 99%.     Lymphatics: No cervical, supraclavicular, axillary, or inguinal nodes Resp: Lungs clear bilaterally, distant breath sounds Cardio: Regular rate and rhythm GI: No mass, nontender, no hepatosplenomegaly Vascular: No leg edema   Lab Results:  Lab Results  Component Value Date   WBC 12.9 (H) 04/05/2022   HGB 17.3 (H) 05/25/2022   HCT 51.0 05/25/2022   MCV 85.1 04/05/2022   PLT 221 04/05/2022   NEUTROABS 9.3 (H) 04/05/2022    CMP  Lab Results  Component Value Date   NA 146 (H) 05/25/2022   K 3.9 05/25/2022   CL 104 05/25/2022   CO2 26 04/03/2022   GLUCOSE 95 05/25/2022   BUN 13 05/25/2022   CREATININE 1.00 05/25/2022   CALCIUM  8.8 (L) 04/03/2022   PROT 6.3 (L) 03/17/2022   ALBUMIN 3.2 (L) 03/17/2022   AST 22 03/17/2022   ALT 20 03/17/2022   ALKPHOS 53 03/17/2022   BILITOT 1.0 03/17/2022   GFRNONAA >60 04/03/2022   GFRAA >60 12/05/2015    Lab Results  Component Value Date   CEA1 2.1 03/12/2022   CEA 1.57 12/09/2023      Medications: I have reviewed the patient's current medications.   Assessment/Plan: Goblet cell adenocarcinoma of the appendix, stage 2A (T3N0) Appendectomy 03/16/2022 low-grade blood cell adenocarcinoma with abundant extracellular mucin diffusely infiltrating the appendiceal wall and focally invading the periappendiceal adipose, pT3, margins negative, perineural invasion  present, no lymphovascular invasion, all margins negative for noninvasive tumor and mucin, all margins negative for invasive carcinoma, MSS, no loss of mismatch repair protein expression Right colon resection 04/02/2022, no residual adenocarcinoma, no metastatic carcinoma in 8 lymph nodes MRI prostate 01/04/2022-venous varix versus dilated and potentially enhancing segment of the appendix CT abdomen/pelvis 01/25/2022-prostamegaly dilated appendix new from 2017 consistent with an appendiceal mucocele Colonoscopy 01/25/2023: No polyps  2.   Chronic DVT in the left proximal superficial femoral vein on prostate MRI 01/04/2022 3.   Prostate cancer Gleason 3+3 prostate cancer in 2019, 2% in 1/12 cores on biopsy 04/19/2017 PSA 11 at time of diagnosis PSA 15.8 on 12/01/2021 Prostate MRI 01/04/2022-4 peripheral zone lesions, 2 PI-RADS 5 on the left, 1 PI-RADS left, and 1 PI-RADS 3 on the right CT abdomen/pelvis 01/25/2022-severe prostamegaly, no evidence of lymphadenopathy or osseous metastatic disease MRI fusion biopsy of the prostate 02/03/2019 23-8/28 core biopsies positive with maximum Gleason score 4+4 PSMA PET 02/17/2022-focal prostate adenocarcinoma in the left peripheral zone, no evidence of metastatic adenopathy, no evidence of visceral metastases or skeletal metastases He completed prostate radiation 06/07/2022 - 08/05/2022 4.  Family history of multiple cancers-Ambry genetic panel-MSH3 and MSH6 VUS  5.  Glaucoma     Disposition: Mr Molesworth is in clinical remission from appendix carcinoma.  He will return for an office visit and CEA in 6 months.  He continues follow-up with [facility] for prostate cancer.  Coni Deep, MD  12/09/2023  10:17 AM

## 2023-12-12 ENCOUNTER — Telehealth: Payer: Self-pay | Admitting: Oncology

## 2023-12-12 NOTE — Telephone Encounter (Signed)
-----   Message from Coni Deep sent at 12/09/2023  9:08 PM EDT ----- Please call patient, CEA is normal, f/u as scheduled  ----- Message ----- From: Dannis Dy, Lab In [place] Sent: 12/09/2023  10:05 AM EDT To: Sumner Ends, MD

## 2023-12-12 NOTE — Telephone Encounter (Signed)
 Patient gave verbal understanding and had no further questions or concerns

## 2023-12-12 NOTE — Telephone Encounter (Signed)
 Patient has been scheduled for follow-up visit per 12/09/23 LOS.  LVM notifying pt of appt details, provided my direct number to pt if appt changes need to be made.

## 2024-01-16 DIAGNOSIS — N401 Enlarged prostate with lower urinary tract symptoms: Secondary | ICD-10-CM | POA: Diagnosis not present

## 2024-01-16 DIAGNOSIS — I10 Essential (primary) hypertension: Secondary | ICD-10-CM | POA: Diagnosis not present

## 2024-01-16 DIAGNOSIS — C61 Malignant neoplasm of prostate: Secondary | ICD-10-CM | POA: Diagnosis not present

## 2024-01-16 DIAGNOSIS — E7849 Other hyperlipidemia: Secondary | ICD-10-CM | POA: Diagnosis not present

## 2024-01-16 DIAGNOSIS — E7800 Pure hypercholesterolemia, unspecified: Secondary | ICD-10-CM | POA: Diagnosis not present

## 2024-01-23 DIAGNOSIS — M19042 Primary osteoarthritis, left hand: Secondary | ICD-10-CM | POA: Diagnosis not present

## 2024-01-23 DIAGNOSIS — C61 Malignant neoplasm of prostate: Secondary | ICD-10-CM | POA: Diagnosis not present

## 2024-01-23 DIAGNOSIS — Z87442 Personal history of urinary calculi: Secondary | ICD-10-CM | POA: Diagnosis not present

## 2024-01-23 DIAGNOSIS — E669 Obesity, unspecified: Secondary | ICD-10-CM | POA: Diagnosis not present

## 2024-01-23 DIAGNOSIS — R82998 Other abnormal findings in urine: Secondary | ICD-10-CM | POA: Diagnosis not present

## 2024-01-23 DIAGNOSIS — Z0000 Encounter for general adult medical examination without abnormal findings: Secondary | ICD-10-CM | POA: Diagnosis not present

## 2024-01-23 DIAGNOSIS — Z1331 Encounter for screening for depression: Secondary | ICD-10-CM | POA: Diagnosis not present

## 2024-01-23 DIAGNOSIS — H409 Unspecified glaucoma: Secondary | ICD-10-CM | POA: Diagnosis not present

## 2024-01-23 DIAGNOSIS — Z1339 Encounter for screening examination for other mental health and behavioral disorders: Secondary | ICD-10-CM | POA: Diagnosis not present

## 2024-01-23 DIAGNOSIS — F4310 Post-traumatic stress disorder, unspecified: Secondary | ICD-10-CM | POA: Diagnosis not present

## 2024-01-23 DIAGNOSIS — I10 Essential (primary) hypertension: Secondary | ICD-10-CM | POA: Diagnosis not present

## 2024-01-23 DIAGNOSIS — C181 Malignant neoplasm of appendix: Secondary | ICD-10-CM | POA: Diagnosis not present

## 2024-01-23 DIAGNOSIS — E7800 Pure hypercholesterolemia, unspecified: Secondary | ICD-10-CM | POA: Diagnosis not present

## 2024-03-08 DIAGNOSIS — H401132 Primary open-angle glaucoma, bilateral, moderate stage: Secondary | ICD-10-CM | POA: Diagnosis not present

## 2024-03-08 DIAGNOSIS — H31093 Other chorioretinal scars, bilateral: Secondary | ICD-10-CM | POA: Diagnosis not present

## 2024-03-08 DIAGNOSIS — H34812 Central retinal vein occlusion, left eye, with macular edema: Secondary | ICD-10-CM | POA: Diagnosis not present

## 2024-03-08 DIAGNOSIS — H35373 Puckering of macula, bilateral: Secondary | ICD-10-CM | POA: Diagnosis not present

## 2024-06-12 ENCOUNTER — Telehealth: Payer: Self-pay

## 2024-06-12 ENCOUNTER — Inpatient Hospital Stay: Attending: Oncology

## 2024-06-12 ENCOUNTER — Inpatient Hospital Stay

## 2024-06-12 ENCOUNTER — Ambulatory Visit: Admitting: Oncology

## 2024-06-12 ENCOUNTER — Other Ambulatory Visit: Payer: Self-pay | Admitting: *Deleted

## 2024-06-12 ENCOUNTER — Ambulatory Visit: Payer: Self-pay | Admitting: Oncology

## 2024-06-12 VITALS — BP 135/73 | HR 68 | Temp 97.8°F | Resp 18 | Ht 73.0 in | Wt 245.2 lb

## 2024-06-12 DIAGNOSIS — Z23 Encounter for immunization: Secondary | ICD-10-CM

## 2024-06-12 DIAGNOSIS — H409 Unspecified glaucoma: Secondary | ICD-10-CM | POA: Diagnosis not present

## 2024-06-12 DIAGNOSIS — C181 Malignant neoplasm of appendix: Secondary | ICD-10-CM

## 2024-06-12 DIAGNOSIS — C61 Malignant neoplasm of prostate: Secondary | ICD-10-CM | POA: Diagnosis not present

## 2024-06-12 DIAGNOSIS — L988 Other specified disorders of the skin and subcutaneous tissue: Secondary | ICD-10-CM | POA: Diagnosis not present

## 2024-06-12 DIAGNOSIS — I82512 Chronic embolism and thrombosis of left femoral vein: Secondary | ICD-10-CM | POA: Insufficient documentation

## 2024-06-12 DIAGNOSIS — Z923 Personal history of irradiation: Secondary | ICD-10-CM | POA: Diagnosis not present

## 2024-06-12 LAB — CEA (ACCESS): CEA (CHCC): 1.62 ng/mL (ref 0.00–5.00)

## 2024-06-12 MED ORDER — INFLUENZA VAC SPLIT HIGH-DOSE 0.5 ML IM SUSY
0.5000 mL | PREFILLED_SYRINGE | Freq: Once | INTRAMUSCULAR | Status: AC
  Administered 2024-06-12: 10:00:00 0.5 mL via INTRAMUSCULAR
  Filled 2024-06-12: qty 0.5

## 2024-06-12 NOTE — Telephone Encounter (Signed)
 Patient gave verbal understanding and had no further questions.

## 2024-06-12 NOTE — Progress Notes (Signed)
 [facility] OFFICE PROGRESS NOTE   Diagnosis: Appendix carcinoma  INTERVAL HISTORY:   Mr. Woelfel returns as scheduled.  He feels well.  Good appetite.  No difficulty with bowel function.  No bleeding.  He notes a nodule at the left temporal face.  The lesion has enlarged and will not drain.  Objective:  Vital signs in last 24 hours:  Blood pressure 135/73, pulse 68, temperature 97.8 F (36.6 C), resp. rate 18, height 6' 1 (1.854 m), weight 245 lb 3.2 oz (111.2 kg), SpO2 100%.    HEENT: 1.5 cm round mobile cutaneous nodule with a central head at the left temporal face Lymphatics: No cervical, supraclavicular, axillary, or inguinal nodes Resp: Distant breath sounds, no respiratory distress Cardio: Regular rate and rhythm GI: No hepatosplenomegaly, no mass, nontender Vascular: Slow leg edema bilaterally   Lab Results:  Lab Results  Component Value Date   WBC 12.9 (H) 04/05/2022   HGB 17.3 (H) 05/25/2022   HCT 51.0 05/25/2022   MCV 85.1 04/05/2022   PLT 221 04/05/2022   NEUTROABS 9.3 (H) 04/05/2022    CMP  Lab Results  Component Value Date   NA 146 (H) 05/25/2022   K 3.9 05/25/2022   CL 104 05/25/2022   CO2 26 04/03/2022   GLUCOSE 95 05/25/2022   BUN 13 05/25/2022   CREATININE 1.00 05/25/2022   CALCIUM  8.8 (L) 04/03/2022   PROT 6.3 (L) 03/17/2022   ALBUMIN 3.2 (L) 03/17/2022   AST 22 03/17/2022   ALT 20 03/17/2022   ALKPHOS 53 03/17/2022   BILITOT 1.0 03/17/2022   GFRNONAA >60 04/03/2022   GFRAA >60 12/05/2015    Lab Results  Component Value Date   CEA1 2.1 03/12/2022   CEA 1.57 12/09/2023   Medications: I have reviewed the patient's current medications.   Assessment/Plan:  Goblet cell adenocarcinoma of the appendix, stage 2A (T3N0) Appendectomy 03/16/2022 low-grade blood cell adenocarcinoma with abundant extracellular mucin diffusely infiltrating the appendiceal wall and focally invading the periappendiceal adipose, pT3, margins  negative, perineural invasion present, no lymphovascular invasion, all margins negative for noninvasive tumor and mucin, all margins negative for invasive carcinoma, MSS, no loss of mismatch repair protein expression Right colon resection 04/02/2022, no residual adenocarcinoma, no metastatic carcinoma in 8 lymph nodes MRI prostate 01/04/2022-venous varix versus dilated and potentially enhancing segment of the appendix CT abdomen/pelvis 01/25/2022-prostamegaly dilated appendix new from 2017 consistent with an appendiceal mucocele Colonoscopy 01/25/2023: No polyps  2.   Chronic DVT in the left proximal superficial femoral vein on prostate MRI 01/04/2022 3.   Prostate cancer Gleason 3+3 prostate cancer in 2019, 2% in 1/12 cores on biopsy 04/19/2017 PSA 11 at time of diagnosis PSA 15.8 on 12/01/2021 Prostate MRI 01/04/2022-4 peripheral zone lesions, 2 PI-RADS 5 on the left, 1 PI-RADS left, and 1 PI-RADS 3 on the right CT abdomen/pelvis 01/25/2022-severe prostamegaly, no evidence of lymphadenopathy or osseous metastatic disease MRI fusion biopsy of the prostate 02/03/2019 23-8/28 core biopsies positive with maximum Gleason score 4+4 PSMA PET 02/17/2022-focal prostate adenocarcinoma in the left peripheral zone, no evidence of metastatic adenopathy, no evidence of visceral metastases or skeletal metastases He completed prostate radiation 06/07/2022 - 08/05/2022 4.  Family history of multiple cancers-Ambry genetic panel-MSH3 and MSH6 VUS  5.  Glaucoma     Disposition: James Mckinney is in clinical remission from appendix carcinoma.  We will follow-up on the CEA from today.  He will return for an office visit and CEA in 6 months. He  continues follow-up with urology for prostate cancer. The lesion at the left temporal face appears to be a sebaceous or inclusion cyst.  He is scheduled to see dermatology next month. Arley Hof, MD  06/12/2024  10:03 AM

## 2024-06-12 NOTE — Telephone Encounter (Signed)
 [facility] Clinical Social Work  Clinical Social Work was referred by nurse for assessment of psychosocial needs and advance directives questions.  Clinical Social Worker contacted patient by phone to offer support and assess for needs.  Patient denied any specific questions about documents. Patient aware him and spouse can complete them for free through [facility].   Lizbeth Sprague, LCSW  Clinical Social Worker [facility]

## 2024-06-12 NOTE — Telephone Encounter (Signed)
-----   Message from Arley Hof, MD sent at 06/12/2024 12:18 PM EST ----- Please call patient, the CEA is normal, follow-up as scheduled

## 2024-12-11 ENCOUNTER — Inpatient Hospital Stay: Admitting: Oncology

## 2024-12-11 ENCOUNTER — Inpatient Hospital Stay
